# Patient Record
Sex: Male | Born: 1950
Health system: Southern US, Community
[De-identification: ages and names within clinical notes are randomized; demographics above are authoritative.]

## PROBLEM LIST (undated history)

## (undated) DIAGNOSIS — E785 Hyperlipidemia, unspecified: Secondary | ICD-10-CM

## (undated) DIAGNOSIS — E119 Type 2 diabetes mellitus without complications: Secondary | ICD-10-CM

## (undated) DIAGNOSIS — K219 Gastro-esophageal reflux disease without esophagitis: Secondary | ICD-10-CM

## (undated) DIAGNOSIS — I10 Essential (primary) hypertension: Secondary | ICD-10-CM

## (undated) HISTORY — PX: BACK SURGERY: SHX140

---

## 2016-03-06 DIAGNOSIS — R8299 Other abnormal findings in urine: Secondary | ICD-10-CM | POA: Diagnosis not present

## 2016-03-06 DIAGNOSIS — E784 Other hyperlipidemia: Secondary | ICD-10-CM | POA: Diagnosis not present

## 2016-03-06 DIAGNOSIS — Z125 Encounter for screening for malignant neoplasm of prostate: Secondary | ICD-10-CM | POA: Diagnosis not present

## 2016-03-06 DIAGNOSIS — E119 Type 2 diabetes mellitus without complications: Secondary | ICD-10-CM | POA: Diagnosis not present

## 2016-03-06 DIAGNOSIS — I10 Essential (primary) hypertension: Secondary | ICD-10-CM | POA: Diagnosis not present

## 2016-03-14 DIAGNOSIS — E784 Other hyperlipidemia: Secondary | ICD-10-CM | POA: Diagnosis not present

## 2016-03-14 DIAGNOSIS — Z0000 Encounter for general adult medical examination without abnormal findings: Secondary | ICD-10-CM | POA: Diagnosis not present

## 2016-03-14 DIAGNOSIS — J302 Other seasonal allergic rhinitis: Secondary | ICD-10-CM | POA: Diagnosis not present

## 2016-03-14 DIAGNOSIS — M25511 Pain in right shoulder: Secondary | ICD-10-CM | POA: Diagnosis not present

## 2016-03-14 DIAGNOSIS — I10 Essential (primary) hypertension: Secondary | ICD-10-CM | POA: Diagnosis not present

## 2016-03-14 DIAGNOSIS — E119 Type 2 diabetes mellitus without complications: Secondary | ICD-10-CM | POA: Diagnosis not present

## 2016-03-14 DIAGNOSIS — K279 Peptic ulcer, site unspecified, unspecified as acute or chronic, without hemorrhage or perforation: Secondary | ICD-10-CM | POA: Diagnosis not present

## 2016-03-14 DIAGNOSIS — Z6830 Body mass index (BMI) 30.0-30.9, adult: Secondary | ICD-10-CM | POA: Diagnosis not present

## 2016-03-14 DIAGNOSIS — M5380 Other specified dorsopathies, site unspecified: Secondary | ICD-10-CM | POA: Diagnosis not present

## 2016-09-10 DIAGNOSIS — I10 Essential (primary) hypertension: Secondary | ICD-10-CM | POA: Diagnosis not present

## 2016-09-10 DIAGNOSIS — E784 Other hyperlipidemia: Secondary | ICD-10-CM | POA: Diagnosis not present

## 2016-09-10 DIAGNOSIS — Z6830 Body mass index (BMI) 30.0-30.9, adult: Secondary | ICD-10-CM | POA: Diagnosis not present

## 2016-09-10 DIAGNOSIS — K279 Peptic ulcer, site unspecified, unspecified as acute or chronic, without hemorrhage or perforation: Secondary | ICD-10-CM | POA: Diagnosis not present

## 2016-09-10 DIAGNOSIS — M5380 Other specified dorsopathies, site unspecified: Secondary | ICD-10-CM | POA: Diagnosis not present

## 2016-09-10 DIAGNOSIS — E119 Type 2 diabetes mellitus without complications: Secondary | ICD-10-CM | POA: Diagnosis not present

## 2016-12-18 DIAGNOSIS — Z6829 Body mass index (BMI) 29.0-29.9, adult: Secondary | ICD-10-CM | POA: Diagnosis not present

## 2016-12-18 DIAGNOSIS — M542 Cervicalgia: Secondary | ICD-10-CM | POA: Diagnosis not present

## 2016-12-26 DIAGNOSIS — Z23 Encounter for immunization: Secondary | ICD-10-CM | POA: Diagnosis not present

## 2017-04-07 DIAGNOSIS — R82998 Other abnormal findings in urine: Secondary | ICD-10-CM | POA: Diagnosis not present

## 2017-04-07 DIAGNOSIS — I10 Essential (primary) hypertension: Secondary | ICD-10-CM | POA: Diagnosis not present

## 2017-04-07 DIAGNOSIS — E119 Type 2 diabetes mellitus without complications: Secondary | ICD-10-CM | POA: Diagnosis not present

## 2017-04-07 DIAGNOSIS — Z125 Encounter for screening for malignant neoplasm of prostate: Secondary | ICD-10-CM | POA: Diagnosis not present

## 2017-04-14 DIAGNOSIS — M5380 Other specified dorsopathies, site unspecified: Secondary | ICD-10-CM | POA: Diagnosis not present

## 2017-04-14 DIAGNOSIS — E119 Type 2 diabetes mellitus without complications: Secondary | ICD-10-CM | POA: Diagnosis not present

## 2017-04-14 DIAGNOSIS — K279 Peptic ulcer, site unspecified, unspecified as acute or chronic, without hemorrhage or perforation: Secondary | ICD-10-CM | POA: Diagnosis not present

## 2017-04-14 DIAGNOSIS — J302 Other seasonal allergic rhinitis: Secondary | ICD-10-CM | POA: Diagnosis not present

## 2017-04-14 DIAGNOSIS — E7849 Other hyperlipidemia: Secondary | ICD-10-CM | POA: Diagnosis not present

## 2017-04-14 DIAGNOSIS — M25511 Pain in right shoulder: Secondary | ICD-10-CM | POA: Diagnosis not present

## 2017-04-14 DIAGNOSIS — Z0000 Encounter for general adult medical examination without abnormal findings: Secondary | ICD-10-CM | POA: Diagnosis not present

## 2017-04-14 DIAGNOSIS — Z1389 Encounter for screening for other disorder: Secondary | ICD-10-CM | POA: Diagnosis not present

## 2017-04-14 DIAGNOSIS — I10 Essential (primary) hypertension: Secondary | ICD-10-CM | POA: Diagnosis not present

## 2017-04-14 DIAGNOSIS — Z6829 Body mass index (BMI) 29.0-29.9, adult: Secondary | ICD-10-CM | POA: Diagnosis not present

## 2017-08-18 DIAGNOSIS — H1789 Other corneal scars and opacities: Secondary | ICD-10-CM | POA: Diagnosis not present

## 2017-08-18 DIAGNOSIS — H2513 Age-related nuclear cataract, bilateral: Secondary | ICD-10-CM | POA: Diagnosis not present

## 2017-08-18 DIAGNOSIS — H524 Presbyopia: Secondary | ICD-10-CM | POA: Diagnosis not present

## 2017-08-18 DIAGNOSIS — E119 Type 2 diabetes mellitus without complications: Secondary | ICD-10-CM | POA: Diagnosis not present

## 2017-12-09 DIAGNOSIS — Z23 Encounter for immunization: Secondary | ICD-10-CM | POA: Diagnosis not present

## 2018-09-16 DIAGNOSIS — E119 Type 2 diabetes mellitus without complications: Secondary | ICD-10-CM | POA: Diagnosis not present

## 2018-09-16 DIAGNOSIS — I10 Essential (primary) hypertension: Secondary | ICD-10-CM | POA: Diagnosis not present

## 2018-10-01 DIAGNOSIS — E119 Type 2 diabetes mellitus without complications: Secondary | ICD-10-CM | POA: Diagnosis not present

## 2018-10-01 DIAGNOSIS — E7849 Other hyperlipidemia: Secondary | ICD-10-CM | POA: Diagnosis not present

## 2018-10-01 DIAGNOSIS — Z23 Encounter for immunization: Secondary | ICD-10-CM | POA: Diagnosis not present

## 2018-10-12 DIAGNOSIS — E119 Type 2 diabetes mellitus without complications: Secondary | ICD-10-CM | POA: Diagnosis not present

## 2018-10-13 DIAGNOSIS — M7581 Other shoulder lesions, right shoulder: Secondary | ICD-10-CM | POA: Diagnosis not present

## 2019-01-04 DIAGNOSIS — Z125 Encounter for screening for malignant neoplasm of prostate: Secondary | ICD-10-CM | POA: Diagnosis not present

## 2019-01-04 DIAGNOSIS — E7849 Other hyperlipidemia: Secondary | ICD-10-CM | POA: Diagnosis not present

## 2019-01-04 DIAGNOSIS — E119 Type 2 diabetes mellitus without complications: Secondary | ICD-10-CM | POA: Diagnosis not present

## 2019-05-26 DIAGNOSIS — E119 Type 2 diabetes mellitus without complications: Secondary | ICD-10-CM | POA: Diagnosis not present

## 2019-05-26 DIAGNOSIS — M25511 Pain in right shoulder: Secondary | ICD-10-CM | POA: Diagnosis not present

## 2019-05-26 DIAGNOSIS — K279 Peptic ulcer, site unspecified, unspecified as acute or chronic, without hemorrhage or perforation: Secondary | ICD-10-CM | POA: Diagnosis not present

## 2019-05-26 DIAGNOSIS — E7849 Other hyperlipidemia: Secondary | ICD-10-CM | POA: Diagnosis not present

## 2019-05-26 DIAGNOSIS — R634 Abnormal weight loss: Secondary | ICD-10-CM | POA: Diagnosis not present

## 2019-05-26 DIAGNOSIS — I10 Essential (primary) hypertension: Secondary | ICD-10-CM | POA: Diagnosis not present

## 2019-06-02 DIAGNOSIS — E7849 Other hyperlipidemia: Secondary | ICD-10-CM | POA: Diagnosis not present

## 2019-10-06 DIAGNOSIS — R634 Abnormal weight loss: Secondary | ICD-10-CM | POA: Diagnosis not present

## 2019-10-06 DIAGNOSIS — K279 Peptic ulcer, site unspecified, unspecified as acute or chronic, without hemorrhage or perforation: Secondary | ICD-10-CM | POA: Diagnosis not present

## 2019-10-06 DIAGNOSIS — I10 Essential (primary) hypertension: Secondary | ICD-10-CM | POA: Diagnosis not present

## 2019-10-06 DIAGNOSIS — E785 Hyperlipidemia, unspecified: Secondary | ICD-10-CM | POA: Diagnosis not present

## 2019-10-06 DIAGNOSIS — E119 Type 2 diabetes mellitus without complications: Secondary | ICD-10-CM | POA: Diagnosis not present

## 2019-10-06 DIAGNOSIS — M25511 Pain in right shoulder: Secondary | ICD-10-CM | POA: Diagnosis not present

## 2020-01-07 DIAGNOSIS — E119 Type 2 diabetes mellitus without complications: Secondary | ICD-10-CM | POA: Diagnosis not present

## 2020-01-07 DIAGNOSIS — H5203 Hypermetropia, bilateral: Secondary | ICD-10-CM | POA: Diagnosis not present

## 2020-01-11 DIAGNOSIS — Z23 Encounter for immunization: Secondary | ICD-10-CM | POA: Diagnosis not present

## 2020-02-09 DIAGNOSIS — K279 Peptic ulcer, site unspecified, unspecified as acute or chronic, without hemorrhage or perforation: Secondary | ICD-10-CM | POA: Diagnosis not present

## 2020-02-09 DIAGNOSIS — R634 Abnormal weight loss: Secondary | ICD-10-CM | POA: Diagnosis not present

## 2020-02-09 DIAGNOSIS — Z0000 Encounter for general adult medical examination without abnormal findings: Secondary | ICD-10-CM | POA: Diagnosis not present

## 2020-02-09 DIAGNOSIS — E119 Type 2 diabetes mellitus without complications: Secondary | ICD-10-CM | POA: Diagnosis not present

## 2020-02-09 DIAGNOSIS — M5380 Other specified dorsopathies, site unspecified: Secondary | ICD-10-CM | POA: Diagnosis not present

## 2020-02-09 DIAGNOSIS — E785 Hyperlipidemia, unspecified: Secondary | ICD-10-CM | POA: Diagnosis not present

## 2020-02-09 DIAGNOSIS — I10 Essential (primary) hypertension: Secondary | ICD-10-CM | POA: Diagnosis not present

## 2020-02-09 DIAGNOSIS — M25511 Pain in right shoulder: Secondary | ICD-10-CM | POA: Diagnosis not present

## 2020-04-07 ENCOUNTER — Other Ambulatory Visit: Payer: Self-pay

## 2020-04-07 ENCOUNTER — Emergency Department (HOSPITAL_COMMUNITY): Payer: Medicare Other

## 2020-04-07 ENCOUNTER — Inpatient Hospital Stay (HOSPITAL_COMMUNITY): Payer: Medicare Other

## 2020-04-07 ENCOUNTER — Other Ambulatory Visit (HOSPITAL_COMMUNITY): Payer: Medicare Other

## 2020-04-07 ENCOUNTER — Encounter (HOSPITAL_COMMUNITY): Payer: Self-pay

## 2020-04-07 ENCOUNTER — Observation Stay (HOSPITAL_COMMUNITY)
Admission: EM | Admit: 2020-04-07 | Discharge: 2020-04-08 | Disposition: A | Discharge status: Home / Self Care | Payer: Medicare Other | Attending: Family Medicine | Admitting: Family Medicine

## 2020-04-07 DIAGNOSIS — I672 Cerebral atherosclerosis: Secondary | ICD-10-CM | POA: Diagnosis not present

## 2020-04-07 DIAGNOSIS — I639 Cerebral infarction, unspecified: Secondary | ICD-10-CM | POA: Diagnosis present

## 2020-04-07 DIAGNOSIS — Z20822 Contact with and (suspected) exposure to covid-19: Secondary | ICD-10-CM | POA: Insufficient documentation

## 2020-04-07 DIAGNOSIS — I6322 Cerebral infarction due to unspecified occlusion or stenosis of basilar arteries: Secondary | ICD-10-CM | POA: Diagnosis not present

## 2020-04-07 DIAGNOSIS — R531 Weakness: Secondary | ICD-10-CM | POA: Diagnosis not present

## 2020-04-07 DIAGNOSIS — H539 Unspecified visual disturbance: Secondary | ICD-10-CM | POA: Insufficient documentation

## 2020-04-07 DIAGNOSIS — G459 Transient cerebral ischemic attack, unspecified: Secondary | ICD-10-CM | POA: Diagnosis not present

## 2020-04-07 DIAGNOSIS — R4781 Slurred speech: Secondary | ICD-10-CM | POA: Insufficient documentation

## 2020-04-07 DIAGNOSIS — M6281 Muscle weakness (generalized): Secondary | ICD-10-CM | POA: Diagnosis not present

## 2020-04-07 DIAGNOSIS — I10 Essential (primary) hypertension: Secondary | ICD-10-CM | POA: Diagnosis present

## 2020-04-07 DIAGNOSIS — I6503 Occlusion and stenosis of bilateral vertebral arteries: Secondary | ICD-10-CM | POA: Diagnosis not present

## 2020-04-07 DIAGNOSIS — I6389 Other cerebral infarction: Secondary | ICD-10-CM | POA: Diagnosis not present

## 2020-04-07 DIAGNOSIS — Z87891 Personal history of nicotine dependence: Secondary | ICD-10-CM | POA: Diagnosis not present

## 2020-04-07 DIAGNOSIS — G9389 Other specified disorders of brain: Secondary | ICD-10-CM | POA: Diagnosis not present

## 2020-04-07 DIAGNOSIS — I63539 Cerebral infarction due to unspecified occlusion or stenosis of unspecified posterior cerebral artery: Secondary | ICD-10-CM

## 2020-04-07 DIAGNOSIS — I6523 Occlusion and stenosis of bilateral carotid arteries: Secondary | ICD-10-CM | POA: Diagnosis not present

## 2020-04-07 DIAGNOSIS — Z7984 Long term (current) use of oral hypoglycemic drugs: Secondary | ICD-10-CM | POA: Diagnosis not present

## 2020-04-07 DIAGNOSIS — E119 Type 2 diabetes mellitus without complications: Secondary | ICD-10-CM | POA: Diagnosis not present

## 2020-04-07 DIAGNOSIS — E010 Iodine-deficiency related diffuse (endemic) goiter: Secondary | ICD-10-CM

## 2020-04-07 DIAGNOSIS — R29810 Facial weakness: Secondary | ICD-10-CM | POA: Diagnosis not present

## 2020-04-07 DIAGNOSIS — Z79899 Other long term (current) drug therapy: Secondary | ICD-10-CM | POA: Diagnosis not present

## 2020-04-07 DIAGNOSIS — E041 Nontoxic single thyroid nodule: Secondary | ICD-10-CM | POA: Diagnosis not present

## 2020-04-07 DIAGNOSIS — K219 Gastro-esophageal reflux disease without esophagitis: Secondary | ICD-10-CM | POA: Diagnosis present

## 2020-04-07 DIAGNOSIS — R29818 Other symptoms and signs involving the nervous system: Secondary | ICD-10-CM | POA: Diagnosis not present

## 2020-04-07 DIAGNOSIS — I6330 Cerebral infarction due to thrombosis of unspecified cerebral artery: Secondary | ICD-10-CM | POA: Diagnosis present

## 2020-04-07 DIAGNOSIS — I6350 Cerebral infarction due to unspecified occlusion or stenosis of unspecified cerebral artery: Principal | ICD-10-CM | POA: Insufficient documentation

## 2020-04-07 DIAGNOSIS — Q283 Other malformations of cerebral vessels: Secondary | ICD-10-CM | POA: Diagnosis not present

## 2020-04-07 DIAGNOSIS — E785 Hyperlipidemia, unspecified: Secondary | ICD-10-CM | POA: Diagnosis present

## 2020-04-07 HISTORY — DX: Gastro-esophageal reflux disease without esophagitis: K21.9

## 2020-04-07 HISTORY — DX: Essential (primary) hypertension: I10

## 2020-04-07 HISTORY — DX: Cerebral infarction, unspecified: I63.9

## 2020-04-07 HISTORY — DX: Type 2 diabetes mellitus without complications: E11.9

## 2020-04-07 HISTORY — DX: Hyperlipidemia, unspecified: E78.5

## 2020-04-07 LAB — URINALYSIS, ROUTINE W REFLEX MICROSCOPIC
Bacteria, UA: NONE SEEN
Bilirubin Urine: NEGATIVE
Glucose, UA: 50 mg/dL — AB
Ketones, ur: NEGATIVE mg/dL
Leukocytes,Ua: NEGATIVE
Nitrite: NEGATIVE
Protein, ur: NEGATIVE mg/dL
Specific Gravity, Urine: >1.046 — ABNORMAL HIGH (ref 1.005–1.030)
pH: 5 (ref 5.0–8.0)

## 2020-04-07 LAB — DIFFERENTIAL
Abs Immature Granulocytes: 0.03 10*3/uL (ref 0.00–0.07)
Basophils Absolute: 0.1 10*3/uL (ref 0.0–0.1)
Basophils Relative: 1 %
Eosinophils Absolute: 0.2 10*3/uL (ref 0.0–0.5)
Eosinophils Relative: 2 %
Immature Granulocytes: 0 %
Lymphocytes Relative: 17 %
Lymphs Abs: 1.8 10*3/uL (ref 0.7–4.0)
Monocytes Absolute: 1.1 10*3/uL — ABNORMAL HIGH (ref 0.1–1.0)
Monocytes Relative: 10 %
Neutro Abs: 7.4 10*3/uL (ref 1.7–7.7)
Neutrophils Relative %: 70 %

## 2020-04-07 LAB — RAPID URINE DRUG SCREEN, HOSP PERFORMED
Amphetamines: NOT DETECTED
Barbiturates: NOT DETECTED
Benzodiazepines: NOT DETECTED
Cocaine: NOT DETECTED
Opiates: NOT DETECTED
Tetrahydrocannabinol: NOT DETECTED

## 2020-04-07 LAB — I-STAT CHEM 8, ED
BUN: 17 mg/dL (ref 8–23)
Calcium, Ion: 1.12 mmol/L — ABNORMAL LOW (ref 1.15–1.40)
Chloride: 102 mmol/L (ref 98–111)
Creatinine, Ser: 0.6 mg/dL — ABNORMAL LOW (ref 0.61–1.24)
Glucose, Bld: 189 mg/dL — ABNORMAL HIGH (ref 70–99)
HCT: 44 % (ref 39.0–52.0)
Hemoglobin: 15 g/dL (ref 13.0–17.0)
Potassium: 6.1 mmol/L — ABNORMAL HIGH (ref 3.5–5.1)
Sodium: 136 mmol/L (ref 135–145)
TCO2: 27 mmol/L (ref 22–32)

## 2020-04-07 LAB — CBC
HCT: 43.2 % (ref 39.0–52.0)
Hemoglobin: 15.4 g/dL (ref 13.0–17.0)
MCH: 32 pg (ref 26.0–34.0)
MCHC: 35.6 g/dL (ref 30.0–36.0)
MCV: 89.8 fL (ref 80.0–100.0)
Platelets: 260 10*3/uL (ref 150–400)
RBC: 4.81 MIL/uL (ref 4.22–5.81)
RDW: 12.3 % (ref 11.5–15.5)
WBC: 10.5 10*3/uL (ref 4.0–10.5)
nRBC: 0 % (ref 0.0–0.2)

## 2020-04-07 LAB — CBG MONITORING, ED: Glucose-Capillary: 165 mg/dL — ABNORMAL HIGH (ref 70–99)

## 2020-04-07 LAB — TSH: TSH: 0.838 u[IU]/mL (ref 0.350–4.500)

## 2020-04-07 LAB — COMPREHENSIVE METABOLIC PANEL
ALT: 29 U/L (ref 0–44)
AST: 22 U/L (ref 15–41)
Albumin: 3.8 g/dL (ref 3.5–5.0)
Alkaline Phosphatase: 68 U/L (ref 38–126)
Anion gap: 9 (ref 5–15)
BUN: 12 mg/dL (ref 8–23)
CO2: 23 mmol/L (ref 22–32)
Calcium: 9.2 mg/dL (ref 8.9–10.3)
Chloride: 103 mmol/L (ref 98–111)
Creatinine, Ser: 0.76 mg/dL (ref 0.61–1.24)
GFR, Estimated: >60 mL/min (ref >60)
Glucose, Bld: 198 mg/dL — ABNORMAL HIGH (ref 70–99)
Potassium: 4.2 mmol/L (ref 3.5–5.1)
Sodium: 135 mmol/L (ref 135–145)
Total Bilirubin: 1.1 mg/dL (ref 0.3–1.2)
Total Protein: 6.5 g/dL (ref 6.5–8.1)

## 2020-04-07 LAB — GLUCOSE, CAPILLARY
Glucose-Capillary: 164 mg/dL — ABNORMAL HIGH (ref 70–99)
Glucose-Capillary: 135 mg/dL — ABNORMAL HIGH (ref 70–99)

## 2020-04-07 LAB — PROTIME-INR
INR: 1.1 (ref 0.8–1.2)
Prothrombin Time: 14 seconds (ref 11.4–15.2)

## 2020-04-07 LAB — RESP PANEL BY RT-PCR (FLU A&B, COVID) ARPGX2
Influenza A by PCR: NEGATIVE
Influenza B by PCR: NEGATIVE
SARS Coronavirus 2 by RT PCR: NEGATIVE

## 2020-04-07 LAB — T4, FREE: Free T4: 1.07 ng/dL (ref 0.61–1.12)

## 2020-04-07 LAB — ETHANOL: Alcohol, Ethyl (B): <10 mg/dL (ref <10)

## 2020-04-07 LAB — APTT: aPTT: 25 seconds (ref 24–36)

## 2020-04-07 MED ORDER — CLOPIDOGREL BISULFATE 75 MG PO TABS
75.0000 mg | ORAL_TABLET | Freq: Every day | ORAL | Status: DC
  Administered 2020-04-08: 75 mg via ORAL
  Filled 2020-04-07: qty 1

## 2020-04-07 MED ORDER — IOHEXOL 350 MG/ML SOLN
75.0000 mL | Freq: Once | INTRAVENOUS | Status: AC | PRN
  Administered 2020-04-07: 75 mL via INTRAVENOUS

## 2020-04-07 MED ORDER — CLOPIDOGREL BISULFATE 300 MG PO TABS
300.0000 mg | ORAL_TABLET | Freq: Once | ORAL | Status: AC
  Administered 2020-04-07: 300 mg via ORAL
  Filled 2020-04-07: qty 1

## 2020-04-07 MED ORDER — SODIUM CHLORIDE 0.9 % IV SOLN: INTRAVENOUS | Status: DC

## 2020-04-07 MED ORDER — ASPIRIN 300 MG RE SUPP
300.0000 mg | Freq: Every day | RECTAL | Status: DC
  Filled 2020-04-07: qty 1

## 2020-04-07 MED ORDER — ACETAMINOPHEN 325 MG PO TABS
650.0000 mg | ORAL_TABLET | ORAL | Status: DC | PRN
Start: 2020-04-07 — End: 2020-04-08

## 2020-04-07 MED ORDER — STROKE: EARLY STAGES OF RECOVERY BOOK
Freq: Once | Status: AC
  Filled 2020-04-07: qty 1

## 2020-04-07 MED ORDER — ATORVASTATIN CALCIUM 80 MG PO TABS
80.0000 mg | ORAL_TABLET | Freq: Every day | ORAL | Status: DC
  Administered 2020-04-07 – 2020-04-08 (×2): 80 mg via ORAL
  Filled 2020-04-07 (×2): qty 1

## 2020-04-07 MED ORDER — ENOXAPARIN SODIUM 40 MG/0.4ML ~~LOC~~ SOLN
40.0000 mg | SUBCUTANEOUS | Status: DC
  Administered 2020-04-07 – 2020-04-08 (×2): 40 mg via SUBCUTANEOUS
  Filled 2020-04-07 (×2): qty 0.4

## 2020-04-07 MED ORDER — ACETAMINOPHEN 650 MG RE SUPP: 650.0000 mg | RECTAL | Status: DC | PRN

## 2020-04-07 MED ORDER — ACETAMINOPHEN 160 MG/5ML PO SOLN: 650.0000 mg | ORAL | Status: DC | PRN

## 2020-04-07 MED ORDER — INSULIN ASPART 100 UNIT/ML ~~LOC~~ SOLN
0.0000 [IU] | Freq: Three times a day (TID) | SUBCUTANEOUS | Status: DC
  Administered 2020-04-07: 3 [IU] via SUBCUTANEOUS
  Administered 2020-04-08: 5 [IU] via SUBCUTANEOUS
  Administered 2020-04-08: 2 [IU] via SUBCUTANEOUS

## 2020-04-07 MED ORDER — SENNOSIDES-DOCUSATE SODIUM 8.6-50 MG PO TABS: 1.0000 | ORAL_TABLET | Freq: Every evening | ORAL | Status: DC | PRN

## 2020-04-07 MED ORDER — ONDANSETRON HCL 4 MG/2ML IJ SOLN: 4.0000 mg | Freq: Four times a day (QID) | INTRAMUSCULAR | Status: DC | PRN

## 2020-04-07 MED ORDER — ASPIRIN 81 MG PO CHEW
324.0000 mg | CHEWABLE_TABLET | Freq: Every day | ORAL | Status: DC
  Administered 2020-04-07 – 2020-04-08 (×2): 324 mg via ORAL
  Filled 2020-04-07 (×2): qty 4

## 2020-04-07 NOTE — ED Provider Notes (Signed)
[facility] EMERGENCY DEPARTMENT Provider Note   CSN: [id number] Arrival date & time: 04/07/20  5621     History Chief Complaint  Patient presents with  . Cerebrovascular Accident    Christopher Kirk is a 70 y.o. male.  HPI Patient reports that last night around 730 he had an episode whereby he could not move the left side of his body.  He reports his vision seemed off.  He reports it was 1 area of the vision that seem to have a kaleidoscope pattern and it that was interfering with his vision and there was some general blurring.  He reports this lasted for about an hour.  He describes it as having resolved.  He reports he went to sleep and then this morning when his wife came to get him or wake him he could not get up.  He reports again he could not move or control the left side of his body.  They called EMS.  He reports by the time EMS arrived he was able to stand and ambulate and could use his extremities again.  He thought he may be having mini strokes.  No headache.  Symptoms are resolved at this current time.  Patient's recent medical history is for general illness.  Patient reports he thinks he might have had COVID.  Approximately 11 days ago, he had 4 days of vomiting and diarrhea.  He reports that it started to improve and then for the subsequent 4 days he could hardly eat anything.  He reports he spent basically over a week in bed.  He reports he is only now doing slightly better.  He reports he is still mostly been bedbound, only getting up to go to the bathroom.  He reports he has been taking in fluids and "electrolytes".  He reports he had a normal bowel movement this morning.  He reports his stool is chronically loose due to Glucophage.  Patient reports he had not been able to take any of his regular medications for almost the course of his illness.  He reports last night he took a first dose of some of his regular prescribed medicines.    Past Medical History:   Diagnosis Date  . Diabetes mellitus without complication (HCC)   . Hypertension     Patient Active Problem List   Diagnosis Date Noted  . 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 Acute CVA (cerebrovascular accident) (HCC) 04/07/2020    Past Surgical History:  Procedure Laterality Date  . BACK SURGERY         No family history on file.  Social History   Tobacco Use  . Smoking status: Former Smoker    Types: Cigarettes  . Smokeless tobacco: Never Used  Substance Use Topics  . Alcohol use: Yes    Comment: occ  . Drug use: Never    Home Medications Prior to Admission medications   Medication Sig Start Date End Date Taking? Authorizing Provider  amLODipine (NORVASC) 10 MG tablet Take 10 mg by mouth daily. 03/01/20  Yes [provider]  carvedilol (COREG) 6.25 MG tablet Take 6.25 mg by mouth 2 (two) times daily. 03/01/20  Yes [provider]  irbesartan (AVAPRO) 300 MG tablet Take 300 mg by mouth daily. 02/29/20  Yes [provider]  metFORMIN (GLUCOPHAGE) 1000 MG tablet Take 1,000 mg by mouth 2 (two) times daily. 02/29/20  Yes [provider]  pravastatin (PRAVACHOL) 40 MG tablet Take 40 mg by  mouth daily. 02/29/20  Yes [provider]    Allergies    Patient has no known allergies.  Review of Systems   Review of Systems 10 systems reviewed and negative except as per HPI Physical Exam Updated Vital Signs BP (!) 174/96 (BP Location: Right Arm)   Pulse 66   Temp 98 F (36.7 C) (Oral)   Resp (!) 25   Ht 5\' 10"  (1.778 m)   Wt 81.6 kg   SpO2 100%   BMI 25.83 kg/m   Physical Exam Constitutional:      Comments: Alert and nontoxic.  Clear mental status.  No respiratory distress.  HENT:     Head: Normocephalic and atraumatic.     Nose: Nose normal.     Mouth/Throat:     Mouth: Mucous membranes are moist.     Pharynx: Oropharynx is clear.  Eyes:     Extraocular Movements: Extraocular movements intact.      Conjunctiva/sclera: Conjunctivae normal.     Pupils: Pupils are equal, round, and reactive to light.  Cardiovascular:     Rate and Rhythm: Normal rate and regular rhythm.  Pulmonary:     Effort: Pulmonary effort is normal.     Comments: Trace crackles at left lung base.  Otherwise lungs are clear with good airflow. Abdominal:     General: There is no distension.     Palpations: Abdomen is soft.     Tenderness: There is no abdominal tenderness. There is no guarding.  Musculoskeletal:        General: No swelling or tenderness. Normal range of motion.     Cervical back: Neck supple.     Right lower leg: No edema.     Left lower leg: No edema.     Comments: No peripheral edema.  Lower legs are in good condition.  Skin:    General: Skin is warm and dry.  Neurological:     Comments: Patient is alert and oriented x3.  Speech is clear with normal content.  No cranial nerve deficit.  Finger-nose exam intact.  Motor strength 5\5 upper and lower extremities.  Patient can hold and elevate legs for 10 seconds.  No sensory deficit.  No visual field cut.  Psychiatric:        Mood and Affect: Mood normal.     ED Results / Procedures / Treatments   Labs (all labs ordered are listed, but only abnormal results are displayed) Labs Reviewed  DIFFERENTIAL - Abnormal; Notable for the following components:      Result Value   Monocytes Absolute 1.1 (*)    All other components within normal limits  COMPREHENSIVE METABOLIC PANEL - Abnormal; Notable for the following components:   Glucose, Bld 198 (*)    All other components within normal limits  I-STAT CHEM 8, ED - Abnormal; Notable for the following components:   Potassium 6.1 (*)    Creatinine, Ser 0.60 (*)    Glucose, Bld 189 (*)    Calcium, Ion 1.12 (*)    All other components within normal limits  RESP PANEL BY RT-PCR (FLU A&B, COVID) ARPGX2  ETHANOL  PROTIME-INR  APTT  CBC  RAPID URINE DRUG SCREEN, HOSP PERFORMED  URINALYSIS, ROUTINE W  REFLEX MICROSCOPIC    EKG EKG Interpretation  Date/Time:  Friday April 07 2020 09:32:06 EST Ventricular Rate:  62 PR Interval:    QRS Duration: 90 QT Interval:  425 QTC Calculation: 432 R Axis:   61 Text Interpretation: Sinus  rhythm Minimal ST elevation, inferior leads early repolarization, no old co parison Confirmed by Arby Barrette [phone]) on 04/07/2020 9:39:58 AM   Radiology CT ANGIO HEAD W OR WO CONTRAST  Result Date: 04/07/2020 CLINICAL DATA:  Neuro deficit, acute, stroke suspected. EXAM: CT ANGIOGRAPHY HEAD AND NECK TECHNIQUE: Multidetector CT imaging of the head and neck was performed using the standard protocol during bolus administration of intravenous contrast. Multiplanar CT image reconstructions and MIPs were obtained to evaluate the vascular anatomy. Carotid stenosis measurements (when applicable) are obtained utilizing NASCET criteria, using the distal internal carotid diameter as the denominator. CONTRAST:  32mL OMNIPAQUE IOHEXOL 350 MG/ML SOLN COMPARISON:  MRI of the brain April 07, 2020 FINDINGS: CT HEAD FINDINGS Brain: Hypodense areas within the left occipital lobe and bilateral cerebellar hemispheres consistent with acute infarct seen on recent MRI of the brain. A hypodense area in the anterior aspect of the medulla does not have a corresponding diffusion restriction on recent MRI., may represent new infarct versus artifact. No hemorrhage, hydrocephalus, mass or midline shift. Vascular: No hyperdense vessel. Calcified plaques in the right vertebral artery. Skull: Normal. Negative for fracture or focal lesion. Sinuses: Mucosal thickening scattered throughout the paranasal sinuses. Orbits: No acute finding. Review of the MIP images confirms the above findings CTA NECK FINDINGS Aortic arch: Standard branching. Imaged portion shows no evidence of aneurysm or dissection. Mild atherosclerotic changes of the aortic arch. No significant stenosis of the major arch vessel origins. Right  carotid system: Mild atherosclerotic changes of the right carotid bifurcation. No evidence of dissection, stenosis (50% or greater) or occlusion. Left carotid system: Mild atherosclerotic changes of the left carotid bifurcation. No evidence of dissection, stenosis (50% or greater) or occlusion. Vertebral arteries: Atherosclerotic changes at the origin of the right vertebral artery and along the V1 and proximal V2 segments with multifocal area of severe stenosis. Distal V2 segment and V3 segment have normal caliber. The right vertebral artery is dominant. The left vertebral artery is hypoplastic with small caliber throughout its entire course ending in PICA. Skeleton: Negative. Other neck: Enlarged multinodular thyroid. Upper chest: Centrilobular emphysema. Review of the MIP images confirms the above findings CTA HEAD FINDINGS Anterior circulation: No significant stenosis, proximal occlusion, aneurysm, or vascular malformation. 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 The left vertebral artery is hypoplastic and ends in PICA. The basilar artery is patent, likely filled by retrograde flow from right posterior communicating artery, with diminutive caliber throughout its course which may be constitutional versus related to decreased flow. There is luminal irregularity with mild stenosis at the proximal segment suggestive of atherosclerotic disease. Diffuse luminal irregularity of the bilateral posterior cerebral arteries is also suggestive of intracranial atherosclerotic disease with focal area of moderate stenosis at the bilateral P2 segments. Venous sinuses: Poorly opacified due to contrast bolus timing. Anatomic variants: Left vertebral artery ending in PICA. Review of the MIP images confirms the above findings IMPRESSION: 1. Hypodense areas within the left occipital lobe and bilateral cerebellar hemispheres consistent with acute infarct seen on recent MRI of the brain. A  hypodense area in the anterior aspect of the medulla does not have a corresponding diffusion restriction on recent MRI, may represent new infarct versus artifact. Correlate clinically. 2. 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 The left vertebral artery is hypoplastic and ends in PICA. 3. The basilar artery is patent, likely filled by retrograde flow from right posterior communicating artery, with diminutive caliber throughout its  course which may be constitutional versus related to decreased flow. Luminal irregularity in the proximal basilar artery suggestive of intracranial atherosclerotic disease. 4. Diffuse luminal irregularity of the bilateral posterior cerebral arteries is also suggestive of intracranial atherosclerotic disease with focal area of moderate stenosis at the bilateral P2 segments. 5. Mild atherosclerotic changes of the carotid bifurcations without evidence of stenosis (50% or greater) or occlusion. 6. Enlarged multinodular thyroid thyroid gland. Recommend thyroid ultrasound for further evaluation. 7. Emphysema and aortic atherosclerosis. These results were communicated via secure text paging at the time of interpretation on 04/07/2020 at 12:39 pm to provider [facility] , who acknowledged these results. Aortic Atherosclerosis (ICD10-I70.0) and Emphysema (ICD10-J43.9). Electronically Signed   By: Baldemar Lenis M.D.   On: 04/07/2020 12:55   CT ANGIO NECK W OR WO CONTRAST  Result Date: 04/07/2020 CLINICAL DATA:  Neuro deficit, acute, stroke suspected. EXAM: CT ANGIOGRAPHY HEAD AND NECK TECHNIQUE: Multidetector CT imaging of the head and neck was performed using the standard protocol during bolus administration of intravenous contrast. Multiplanar CT image reconstructions and MIPs were obtained to evaluate the vascular anatomy. Carotid stenosis measurements (when applicable) are obtained utilizing NASCET criteria, using the distal internal carotid diameter as  the denominator. CONTRAST:  33mL OMNIPAQUE IOHEXOL 350 MG/ML SOLN COMPARISON:  MRI of the brain April 07, 2020 FINDINGS: CT HEAD FINDINGS Brain: Hypodense areas within the left occipital lobe and bilateral cerebellar hemispheres consistent with acute infarct seen on recent MRI of the brain. A hypodense area in the anterior aspect of the medulla does not have a corresponding diffusion restriction on recent MRI., may represent new infarct versus artifact. No hemorrhage, hydrocephalus, mass or midline shift. Vascular: No hyperdense vessel. Calcified plaques in the right vertebral artery. Skull: Normal. Negative for fracture or focal lesion. Sinuses: Mucosal thickening scattered throughout the paranasal sinuses. Orbits: No acute finding. Review of the MIP images confirms the above findings CTA NECK FINDINGS Aortic arch: Standard branching. Imaged portion shows no evidence of aneurysm or dissection. Mild atherosclerotic changes of the aortic arch. No significant stenosis of the major arch vessel origins. Right carotid system: Mild atherosclerotic changes of the right carotid bifurcation. No evidence of dissection, stenosis (50% or greater) or occlusion. Left carotid system: Mild atherosclerotic changes of the left carotid bifurcation. No evidence of dissection, stenosis (50% or greater) or occlusion. Vertebral arteries: Atherosclerotic changes at the origin of the right vertebral artery and along the V1 and proximal V2 segments with multifocal area of severe stenosis. Distal V2 segment and V3 segment have normal caliber. The right vertebral artery is dominant. The left vertebral artery is hypoplastic with small caliber throughout its entire course ending in PICA. Skeleton: Negative. Other neck: Enlarged multinodular thyroid. Upper chest: Centrilobular emphysema. Review of the MIP images confirms the above findings CTA HEAD FINDINGS Anterior circulation: No significant stenosis, proximal occlusion, aneurysm, or vascular  malformation. 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 The left vertebral artery is hypoplastic and ends in PICA. The basilar artery is patent, likely filled by retrograde flow from right posterior communicating artery, with diminutive caliber throughout its course which may be constitutional versus related to decreased flow. There is luminal irregularity with mild stenosis at the proximal segment suggestive of atherosclerotic disease. Diffuse luminal irregularity of the bilateral posterior cerebral arteries is also suggestive of intracranial atherosclerotic disease with focal area of moderate stenosis at the bilateral P2 segments. Venous sinuses: Poorly opacified due to contrast bolus timing.  Anatomic variants: Left vertebral artery ending in PICA. Review of the MIP images confirms the above findings IMPRESSION: 1. Hypodense areas within the left occipital lobe and bilateral cerebellar hemispheres consistent with acute infarct seen on recent MRI of the brain. A hypodense area in the anterior aspect of the medulla does not have a corresponding diffusion restriction on recent MRI, may represent new infarct versus artifact. Correlate clinically. 2. 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 The left vertebral artery is hypoplastic and ends in PICA. 3. The basilar artery is patent, likely filled by retrograde flow from right posterior communicating artery, with diminutive caliber throughout its course which may be constitutional versus related to decreased flow. Luminal irregularity in the proximal basilar artery suggestive of intracranial atherosclerotic disease. 4. Diffuse luminal irregularity of the bilateral posterior cerebral arteries is also suggestive of intracranial atherosclerotic disease with focal area of moderate stenosis at the bilateral P2 segments. 5. Mild atherosclerotic changes of the carotid bifurcations without evidence of stenosis (50% or  greater) or occlusion. 6. Enlarged multinodular thyroid thyroid gland. Recommend thyroid ultrasound for further evaluation. 7. Emphysema and aortic atherosclerosis. These results were communicated via secure text paging at the time of interpretation on 04/07/2020 at 12:39 pm to provider [facility] , who acknowledged these results. Aortic Atherosclerosis (ICD10-I70.0) and Emphysema (ICD10-J43.9). Electronically Signed   By: Baldemar LenisKatyucia  De Macedo Rodrigues M.D.   On: 04/07/2020 12:55   MR BRAIN WO CONTRAST  Result Date: 04/07/2020 CLINICAL DATA:  Diabetes and hypertension. Acute stroke presentation. EXAM: MRI HEAD WITHOUT CONTRAST TECHNIQUE: Multiplanar, multiecho pulse sequences of the brain and surrounding structures were obtained without intravenous contrast. COMPARISON:  None. FINDINGS: Brain: Diffusion imaging shows multiple scattered small acute infarctions within both cerebellar hemispheres. There is a larger confluent infarction affecting the left occipital lobe measuring up to 3 cm in size. No evidence of acute anterior circulation insult. No evidence of hemorrhage or significant swelling/mass effect. Elsewhere, there is mild age related volume loss with minimal small vessel change of the cerebral hemispheric white matter. No hydrocephalus. Vascular: Flow is present in both carotid arteries. There is abnormal flow in the posterior circulation. Left vertebral artery is probably a tiny vessel that terminates in PICA. Dominant right vertebral artery shows atherosclerosis and does not show abnormal flow. There is also not normal flow in the proximal basilar artery. Skull and upper cervical spine: Negative Sinuses/Orbits: Clear/normal Other: None IMPRESSION: 1. Multiple scattered small acute infarctions within both cerebellar hemispheres. Larger confluent infarction affecting the left occipital lobe measuring up to 3 cm in size. No evidence of hemorrhage or mass effect. 2. Abnormal flow in the posterior  circulation. Left vertebral artery is probably a tiny vessel that terminates in PICA. There is an abnormal flow appearance of the dominant right vertebral artery and proximal basilar artery. Electronically Signed   By: Paulina FusiMark  Shogry M.D.   On: 04/07/2020 11:45    Procedures Procedures  CRITICAL CARE Performed by: Arby BarretteMarcy Shalom Ware   Total critical care time: 30  minutes  Critical care time was exclusive of separately billable procedures and treating other patients.  Critical care was necessary to treat or prevent imminent or life-threatening deterioration.  Critical care was time spent personally by me on the following activities: development of treatment plan with patient and/or surrogate as well as nursing, discussions with consultants, evaluation of patient's response to treatment, examination of patient, obtaining history from patient or surrogate, ordering and performing treatments and interventions, ordering and review  of laboratory studies, ordering and review of radiographic studies, pulse oximetry and re-evaluation of patient's condition. Medications Ordered in ED Medications  clopidogrel (PLAVIX) tablet 300 mg (has no administration in time range)  clopidogrel (PLAVIX) tablet 75 mg (has no administration in time range)  aspirin chewable tablet 324 mg (has no administration in time range)    Or  aspirin suppository 300 mg (has no administration in time range)  atorvastatin (LIPITOR) tablet 80 mg (has no administration in time range)  iohexol (OMNIPAQUE) 350 MG/ML injection 75 mL (75 mLs Intravenous Contrast Given 04/07/20 1231)    ED Course  I have reviewed the triage vital signs and the nursing notes.  Pertinent labs & imaging results that were available during my care of the patient were reviewed by me and considered in my medical decision making (see chart for details).  Clinical Course as of 04/07/20 1308  Fri Apr 07, 2020  1005 Consult: Reviewed with Dr. Iver Nestle neurology.   She advises to proceed with MRI in favor of CT due to high probability of TIA.  At that time she will determine if patient should go on to have CT angiogram versus MR angio. [MP]    Clinical Course User Index [MP] Arby Barrette, MD   MDM Rules/Calculators/A&P                          Patient arrives with previous left-sided weakness and dysfunction.  This has resolved.  He describes either 2 episodes or possibly 1 continuous episode as the patient awakened with symptoms this morning.  Symptoms are now resolved.  He described dense neurologic symptoms concerning for TIA /stroke.  Exam is normal on arrival to the emergency department.  MRI scan confirms acute areas of embolic stroke.  Patient's initial evaluation emergency department is for NIH score of 0.  His mental status is clear without signs of encephalopathy.  Patient will require admission and ongoing management by neurology and admitted to medical service. Final Clinical Impression(s) / ED Diagnoses Final diagnoses:  Acute CVA (cerebrovascular accident) [facility])    Rx / DC Orders ED Discharge Orders    None       Arby Barrette, MD 04/07/20 1317

## 2020-04-07 NOTE — ED Triage Notes (Signed)
Pt arrived via [facility] from home for right arm, right leg, right side of face weakness and slurred speech that started at 0730 this morning. Pt states he had similar sx last night, but not sure what time last night. Pt states the sx are worse today. Pt states has had N/V/D, Hax1 wk. Pt states has not taken meds for past 1.5 wks due to N/V. Pt is A&Ox4.  Pt is hyper tensive. NSR on monitor.

## 2020-04-07 NOTE — ED Notes (Signed)
Dinner Tray Ordered @ 1707. 

## 2020-04-07 NOTE — ED Notes (Signed)
Patient transported to CT 

## 2020-04-07 NOTE — ED Notes (Signed)
Transported to 3W-6 via transport svc

## 2020-04-07 NOTE — ED Notes (Signed)
Patient transported to MRI 

## 2020-04-07 NOTE — Procedures (Signed)
Echo attempted. Patient being taken to ultrasound. Will attempt echo again as time permits.

## 2020-04-07 NOTE — ED Notes (Signed)
Waiting on transport svc to transfer pt to 3w-6

## 2020-04-07 NOTE — Consult Note (Addendum)
Neurology Consultation Reason for Consult: stroke Requesting Physician: Charlesetta Shanks  CC: suspected TIA  History is obtained from:chart review, patient, and wife  HPI: Christopher Kirk is a 70 y.o. male with significant PMH of DM, HTN, HLD, abnormal weight loss, peptic ulcer, cataracts bilaterally, prior smoker who presents with weakness of left side of his body and visual disturbances beginning approximately 1930 yesterday. Initial incident lasted about 5 minutes and experienced full recovery so did not present to ED. He woke up this morning with recurrent symptoms of left extremity weakness. Wife also endorses that he had right mouth crease droop and dysarthria. He was unable to sit up in bed so she called EMS. By the time they got there, approximately 45 minutes after onset of symptoms this morning, he was sitting up in bed and reports being almost fully recovered. They deny noticing any twitching, shaking, or repetitive movements.  He does endorse that for the last week, he has had off and on headache that he describes as throbbing achiness on his R side from the back of his neck going to the top of the head and then forward behind his R eye. He is not someone who usually has headaches. Reports in addition, has been feeling sick and nausea and skipped multiple medications. Endorses had some R sided vision blurriness and seeing flareup of red and green. Also feels that his vision is "wonky" and has noticed that the size of some objest is disproportoinate. Denies any vision loss, vision is now back to normal. Denies any perceived deficits and reports feeling back to his normal self.  Had recent illness and had been recovering. COVID test pending currently. He missed approximately 1 week of all his medication due to illness and started back again yesterday.  LKW: yesterday evening prior to 1930. tPA given?: No, outside window and resolution of symptoms. Thrombectomy: No, resolution of  symptoms. Premorbid modified rankin scale:     0 - No symptoms.  ROS: All other review of systems was negative except as noted in the HPI.  Past Medical History:  Diagnosis Date  . Diabetes mellitus without complication ([place])   . Hypertension    HLD, abnormal weight loss, peptic ulcer, cataracts bilaterally  No family history on file.  Social History:  reports that he has quit smoking. His smoking use included cigarettes. He has never used smokeless tobacco. He reports current alcohol use. He reports that he does not use drugs.  Exam: Current vital signs: BP (!) 166/97   Pulse 66   Temp 98 F (36.7 C) (Oral)   Resp 18   Ht '5\' 10"'  (1.778 m)   Wt 81.6 kg   SpO2 100%   BMI 25.83 kg/m  Vital signs in last 24 hours: Temp:  [98 F (36.7 C)] 98 F (36.7 C) (03/11 0933) Pulse Rate:  [64-66] 66 (03/11 1030) Resp:  [16-20] 18 (03/11 1030) BP: (166-196)/(90-100) 166/97 (03/11 1030) SpO2:  [98 %-100 %] 100 % (03/11 1030) Weight:  [81.6 kg] 81.6 kg (03/11 0934)   Physical Exam  Constitutional: Appears well-developed and well-nourished.  Psych: Affect appropriate to situation Eyes: No scleral injection HENT: No oropharyngeal obstruction.  MSK: no joint deformities.  Cardiovascular: Normal rate and regular rhythm.  Respiratory: Effort normal, non-labored breathing GI: Soft.  No distension. There is no tenderness.  Skin: Warm dry and intact visible skin  Neuro: Mental Status: Patient is awake, alert, oriented to person, place, month, year, and situation. Patient is able to  give a clear and coherent history. No signs of aphasia or neglect Object identification normal Cranial Nerves: II: Visual Fields are full. Pupils are equal, round, and reactive to light.  III,IV, VI: EOMI without ptosis or diploplia.  V: Facial sensation is symmetric to temperature VII: Facial movement is symmetric.  VIII: hearing is intact to voice X: Uvula elevates symmetrically XI: Shoulder shrug  is symmetric. XII: tongue is midline without atrophy or fasciculations.  Motor: Tone is normal. Bulk is normal. 5/5 strength was present in all four extremities. Negative pronator drift Sensory: Sensation is symmetric to light touch and temperature in the arms and legs. Deep Tendon Reflexes: 2+ and symmetric in the biceps and patellae.  Plantars: Toes are downgoing bilaterally.  Cerebellar: FNF and HKS are intact bilaterally  NIHSS total 0   I have reviewed labs in epic and the results pertinent to this consultation are: Glucose of 198.  I have reviewed the images obtained: CT Angio head and neck: 1. Hypodense areas within the left occipital lobe and bilateral cerebellar hemispheres consistent with acute infarct seen on recent MRI of the brain. A hypodense area in the anterior aspect of the medulla does not have a corresponding diffusion restriction on recent MRI, may represent new infarct versus artifact. Correlate clinically. 2. 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 The left vertebral artery is hypoplastic and ends in PICA. 3. The basilar artery is patent, likely filled by retrograde flow from right posterior communicating artery, with diminutive caliber throughout its course which may be constitutional versus related to decreased flow. Luminal irregularity in the proximal basilar artery suggestive of intracranial atherosclerotic disease. 4. Diffuse luminal irregularity of the bilateral posterior cerebral arteries is also suggestive of intracranial atherosclerotic disease with focal area of moderate stenosis at the bilateral P2 segments. 5. Mild atherosclerotic changes of the carotid bifurcations without evidence of stenosis (50% or greater) or occlusion. 6. Enlarged multinodular thyroid thyroid gland. Recommend thyroid ultrasound for further evaluation. 7. Emphysema and aortic atherosclerosis.  MRI Brain without contrast: 1. Multiple scattered  small acute infarctions within both cerebellar hemispheres. Larger confluent infarction affecting the left occipital lobe measuring up to 3 cm in size. No evidence of hemorrhage or mass effect. 2. Abnormal flow in the posterior circulation. Left vertebral artery is probably a tiny vessel that terminates in PICA. There is an abnormal flow appearance of the dominant right vertebral artery and proximal basilar artery.  Impression: 70yo male with PMH of DM, HTN, HLD presents with self resolved episode of left sided weakness x 2 and episode of "wonky vision" and R sided headache with resolution of his neuro symptoms and still has R sided headache. MRI brain demonstrates scattered embolic appearing posterior circulation strokes with multifocal multivessel intracranial stenosis, most notably basilar stenosis.   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 - Stroke labs TSH, ESR, RPR, HgbA1c, fasting lipid panel - Frequent neuro checks - Echocardiogram - Prophylactic therapy-Antiplatelet med: Aspirin 39m and plavix 3092monce now. Followed by Aspirin 815mnd plavix 33m27mily for 90 days. After 90 days, Aspirin 81mg10mly alone. - I switched him to Atorvastatin 80mg 39my which is high intensity statin from Pravastatin 40mg d41m which is a moderate intensity statin. - Risk factor modification - Telemetry monitoring; 30 day event monitor on discharge if no arrythmias captured  - Blood pressure goal: Permissive hypertension to 220/110. Hold home antiHTNive meds. - PT consult, OT consult,  Speech consult, unless patient is back to baseline - Stroke team to follow  - HTN- hypertensive on presentation to 196/90. Goal Blood pressure < 220/110. Hold home medications for 24 hours and only use PRN for SBP > 220 or DBP > 110. Gradual normotension after 24 hours.   - DM- home medications metformin.    [place] Pager Number [phone]

## 2020-04-07 NOTE — H&P (Signed)
History and Physical    Christopher Kirk XBJ:[id number] DOB: 20-Nov-1950 DOA: 04/07/2020  PCP:  [facility]:  None Patient coming from:  Home - lives with wife of 48 years; NOK:  Wife, [phone]  Chief Complaint: Stroke-like symptoms  HPI: Christopher Kirk is a 70 y.o. male with medical history significant of HTN; HLD; and DM presenting with stroke-like symptoms.  He reports that he "felt squirrelly".  Speech slurred, unable to move L arm, tingling on L side.  He has been sick for over a week.  He thought he had COVID - couldn't keep anything down, had a headache.  He never lost his sense of smell or taste or had a fever.  He had L-sided weakness last night arm > leg, lasted 5-10 minutes and resolved completely.  He had recurrent symptoms this AM, unable to move his arm > leg on the L, couldn't talk.  Symptoms today lasted about 45-60 minutes.  He still feels a little like he is in a fog, hard to focus on his phone.  No dysarthria.    He does not think he had dysphagia at all.  He didn't take his medications due to inability to keep anything down for about a week and thinks this led to symptoms.    ED Course:  Stuttering symptoms last night - dense weakness but resolved in an hour.  Redeveloped symptoms this AM and then resolved again.  GI symptoms for 5 days and then fatigue and now this, COVID negative.  MRI with multiple infarcts.  Neurology is consulting.  Currently with a normal exam.  Review of Systems: As per HPI; otherwise review of systems reviewed and negative.   Ambulatory Status:  Ambulates without assistance  COVID Vaccine Status:  Complete but booster  Past Medical History:  Diagnosis Date  . CVA (cerebral vascular accident) (HCC) 04/07/2020  . Diabetes mellitus without complication (HCC)   . Dyslipidemia   . GERD (gastroesophageal reflux disease)   . Hypertension     Past Surgical History:  Procedure Laterality Date  . BACK SURGERY      Social  History   Socioeconomic History  . Marital status: Married    Spouse name: Not on file  . Number of children: Not on file  . Years of education: Not on file  . Highest education level: Not on file  Occupational History  . Occupation: retired  Tobacco Use  . Smoking status: Former Smoker    Packs/day: 3.00    Years: 20.00    Pack years: 60.00    Types: Cigarettes    Quit date: 1987    Years since quitting: 35.2  . Smokeless tobacco: Never Used  Substance and Sexual Activity  . Alcohol use: Yes    Comment: occ  . Drug use: Never  . Sexual activity: Not on file  Other Topics Concern  . Not on file  Social History Narrative  . Not on file   Social Determinants of Health   Financial Resource Strain: Not on file  Food Insecurity: Not on file  Transportation Needs: Not on file  Physical Activity: Not on file  Stress: Not on file  Social Connections: Not on file  Intimate Partner Violence: Not on file    No Known Allergies  Family History  Problem Relation Age of Onset  . Stroke Father   . Stroke Sister   . Stroke Brother        x 3 brothers  Prior to Admission medications   Medication Sig Start Date End Date Taking? Authorizing Provider  amLODipine (NORVASC) 10 MG tablet Take 10 mg by mouth daily. 03/01/20  Yes [provider]  carvedilol (COREG) 6.25 MG tablet Take 6.25 mg by mouth 2 (two) times daily. 03/01/20  Yes [provider]  irbesartan (AVAPRO) 300 MG tablet Take 300 mg by mouth daily. 02/29/20  Yes [provider]  metFORMIN (GLUCOPHAGE) 1000 MG tablet Take 1,000 mg by mouth 2 (two) times daily. 02/29/20  Yes [provider]  pravastatin (PRAVACHOL) 40 MG tablet Take 40 mg by mouth daily. 02/29/20  Yes [provider]    Physical Exam: Vitals:   04/07/20 1009 04/07/20 1030 04/07/20 1200 04/07/20 1209  BP: (!) 181/100 (!) 166/97 (!) 174/96 (!) 174/96  Pulse: 64 66 66 66  Resp: 20 18 (!) 25 (!) 25  Temp:       TempSrc:      SpO2: 99% 100% 100% 100%  Weight:      Height:         . General:  Appears calm and comfortable and is in NAD . Eyes:  PERRL, EOMI, normal lids, iris . ENT:  grossly normal hearing, lips & tongue, mmm; appropriate dentition . Neck:  no LAD, masses or thyromegaly . Cardiovascular:  RRR, no m/r/g. No LE edema.  Marland Kitchen Respiratory:   CTA bilaterally with no wheezes/rales/rhonchi.  Normal respiratory effort. . Abdomen:  soft, NT, ND . Skin:  no rash or induration seen on limited exam . Musculoskeletal:  grossly normal tone BUE/BLE, good ROM, no bony abnormality . Psychiatric:  grossly normal mood and affect, speech fluent and appropriate, AOx3 . Neurologic:  CN 2-12 grossly intact, moves all extremities in coordinated fashion    Radiological Exams on Admission: Independently reviewed - see discussion in A/P where applicable  CT ANGIO HEAD W OR WO CONTRAST  Result Date: 04/07/2020 CLINICAL DATA:  Neuro deficit, acute, stroke suspected. EXAM: CT ANGIOGRAPHY HEAD AND NECK TECHNIQUE: Multidetector CT imaging of the head and neck was performed using the standard protocol during bolus administration of intravenous contrast. Multiplanar CT image reconstructions and MIPs were obtained to evaluate the vascular anatomy. Carotid stenosis measurements (when applicable) are obtained utilizing NASCET criteria, using the distal internal carotid diameter as the denominator. CONTRAST:  8mL OMNIPAQUE IOHEXOL 350 MG/ML SOLN COMPARISON:  MRI of the brain April 07, 2020 FINDINGS: CT HEAD FINDINGS Brain: Hypodense areas within the left occipital lobe and bilateral cerebellar hemispheres consistent with acute infarct seen on recent MRI of the brain. A hypodense area in the anterior aspect of the medulla does not have a corresponding diffusion restriction on recent MRI., may represent new infarct versus artifact. No hemorrhage, hydrocephalus, mass or midline shift. Vascular: No hyperdense vessel. Calcified  plaques in the right vertebral artery. Skull: Normal. Negative for fracture or focal lesion. Sinuses: Mucosal thickening scattered throughout the paranasal sinuses. Orbits: No acute finding. Review of the MIP images confirms the above findings CTA NECK FINDINGS Aortic arch: Standard branching. Imaged portion shows no evidence of aneurysm or dissection. Mild atherosclerotic changes of the aortic arch. No significant stenosis of the major arch vessel origins. Right carotid system: Mild atherosclerotic changes of the right carotid bifurcation. No evidence of dissection, stenosis (50% or greater) or occlusion. Left carotid system: Mild atherosclerotic changes of the left carotid bifurcation. No evidence of dissection, stenosis (50% or greater) or occlusion. Vertebral arteries: Atherosclerotic changes at the origin of the right  vertebral artery and along the V1 and proximal V2 segments with multifocal area of severe stenosis. Distal V2 segment and V3 segment have normal caliber. The right vertebral artery is dominant. The left vertebral artery is hypoplastic with small caliber throughout its entire course ending in PICA. Skeleton: Negative. Other neck: Enlarged multinodular thyroid. Upper chest: Centrilobular emphysema. Review of the MIP images confirms the above findings CTA HEAD FINDINGS Anterior circulation: No significant stenosis, proximal occlusion, aneurysm, or vascular malformation. 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 The left vertebral artery is hypoplastic and ends in PICA. The basilar artery is patent, likely filled by retrograde flow from right posterior communicating artery, with diminutive caliber throughout its course which may be constitutional versus related to decreased flow. There is luminal irregularity with mild stenosis at the proximal segment suggestive of atherosclerotic disease. Diffuse luminal irregularity of the bilateral posterior cerebral  arteries is also suggestive of intracranial atherosclerotic disease with focal area of moderate stenosis at the bilateral P2 segments. Venous sinuses: Poorly opacified due to contrast bolus timing. Anatomic variants: Left vertebral artery ending in PICA. Review of the MIP images confirms the above findings IMPRESSION: 1. Hypodense areas within the left occipital lobe and bilateral cerebellar hemispheres consistent with acute infarct seen on recent MRI of the brain. A hypodense area in the anterior aspect of the medulla does not have a corresponding diffusion restriction on recent MRI, may represent new infarct versus artifact. Correlate clinically. 2. 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 The left vertebral artery is hypoplastic and ends in PICA. 3. The basilar artery is patent, likely filled by retrograde flow from right posterior communicating artery, with diminutive caliber throughout its course which may be constitutional versus related to decreased flow. Luminal irregularity in the proximal basilar artery suggestive of intracranial atherosclerotic disease. 4. Diffuse luminal irregularity of the bilateral posterior cerebral arteries is also suggestive of intracranial atherosclerotic disease with focal area of moderate stenosis at the bilateral P2 segments. 5. Mild atherosclerotic changes of the carotid bifurcations without evidence of stenosis (50% or greater) or occlusion. 6. Enlarged multinodular thyroid thyroid gland. Recommend thyroid ultrasound for further evaluation. 7. Emphysema and aortic atherosclerosis. These results were communicated via secure text paging at the time of interpretation on 04/07/2020 at 12:39 pm to provider [facility] , who acknowledged these results. Aortic Atherosclerosis (ICD10-I70.0) and Emphysema (ICD10-J43.9). Electronically Signed   By: Baldemar Lenis M.D.   On: 04/07/2020 12:55   CT ANGIO NECK W OR WO CONTRAST  Result Date:  04/07/2020 CLINICAL DATA:  Neuro deficit, acute, stroke suspected. EXAM: CT ANGIOGRAPHY HEAD AND NECK TECHNIQUE: Multidetector CT imaging of the head and neck was performed using the standard protocol during bolus administration of intravenous contrast. Multiplanar CT image reconstructions and MIPs were obtained to evaluate the vascular anatomy. Carotid stenosis measurements (when applicable) are obtained utilizing NASCET criteria, using the distal internal carotid diameter as the denominator. CONTRAST:  22mL OMNIPAQUE IOHEXOL 350 MG/ML SOLN COMPARISON:  MRI of the brain April 07, 2020 FINDINGS: CT HEAD FINDINGS Brain: Hypodense areas within the left occipital lobe and bilateral cerebellar hemispheres consistent with acute infarct seen on recent MRI of the brain. A hypodense area in the anterior aspect of the medulla does not have a corresponding diffusion restriction on recent MRI., may represent new infarct versus artifact. No hemorrhage, hydrocephalus, mass or midline shift. Vascular: No hyperdense vessel. Calcified plaques in the right vertebral artery. Skull: Normal.  Negative for fracture or focal lesion. Sinuses: Mucosal thickening scattered throughout the paranasal sinuses. Orbits: No acute finding. Review of the MIP images confirms the above findings CTA NECK FINDINGS Aortic arch: Standard branching. Imaged portion shows no evidence of aneurysm or dissection. Mild atherosclerotic changes of the aortic arch. No significant stenosis of the major arch vessel origins. Right carotid system: Mild atherosclerotic changes of the right carotid bifurcation. No evidence of dissection, stenosis (50% or greater) or occlusion. Left carotid system: Mild atherosclerotic changes of the left carotid bifurcation. No evidence of dissection, stenosis (50% or greater) or occlusion. Vertebral arteries: Atherosclerotic changes at the origin of the right vertebral artery and along the V1 and proximal V2 segments with multifocal area  of severe stenosis. Distal V2 segment and V3 segment have normal caliber. The right vertebral artery is dominant. The left vertebral artery is hypoplastic with small caliber throughout its entire course ending in PICA. Skeleton: Negative. Other neck: Enlarged multinodular thyroid. Upper chest: Centrilobular emphysema. Review of the MIP images confirms the above findings CTA HEAD FINDINGS Anterior circulation: No significant stenosis, proximal occlusion, aneurysm, or vascular malformation. 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 The left vertebral artery is hypoplastic and ends in PICA. The basilar artery is patent, likely filled by retrograde flow from right posterior communicating artery, with diminutive caliber throughout its course which may be constitutional versus related to decreased flow. There is luminal irregularity with mild stenosis at the proximal segment suggestive of atherosclerotic disease. Diffuse luminal irregularity of the bilateral posterior cerebral arteries is also suggestive of intracranial atherosclerotic disease with focal area of moderate stenosis at the bilateral P2 segments. Venous sinuses: Poorly opacified due to contrast bolus timing. Anatomic variants: Left vertebral artery ending in PICA. Review of the MIP images confirms the above findings IMPRESSION: 1. Hypodense areas within the left occipital lobe and bilateral cerebellar hemispheres consistent with acute infarct seen on recent MRI of the brain. A hypodense area in the anterior aspect of the medulla does not have a corresponding diffusion restriction on recent MRI, may represent new infarct versus artifact. Correlate clinically. 2. 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 The left vertebral artery is hypoplastic and ends in PICA. 3. The basilar artery is patent, likely filled by retrograde flow from right posterior communicating artery, with diminutive caliber  throughout its course which may be constitutional versus related to decreased flow. Luminal irregularity in the proximal basilar artery suggestive of intracranial atherosclerotic disease. 4. Diffuse luminal irregularity of the bilateral posterior cerebral arteries is also suggestive of intracranial atherosclerotic disease with focal area of moderate stenosis at the bilateral P2 segments. 5. Mild atherosclerotic changes of the carotid bifurcations without evidence of stenosis (50% or greater) or occlusion. 6. Enlarged multinodular thyroid thyroid gland. Recommend thyroid ultrasound for further evaluation. 7. Emphysema and aortic atherosclerosis. These results were communicated via secure text paging at the time of interpretation on 04/07/2020 at 12:39 pm to provider [facility] , who acknowledged these results. Aortic Atherosclerosis (ICD10-I70.0) and Emphysema (ICD10-J43.9). Electronically Signed   By: Baldemar Lenis M.D.   On: 04/07/2020 12:55   MR BRAIN WO CONTRAST  Result Date: 04/07/2020 CLINICAL DATA:  Diabetes and hypertension. Acute stroke presentation. EXAM: MRI HEAD WITHOUT CONTRAST TECHNIQUE: Multiplanar, multiecho pulse sequences of the brain and surrounding structures were obtained without intravenous contrast. COMPARISON:  None. FINDINGS: Brain: Diffusion imaging shows multiple scattered small acute infarctions within both cerebellar hemispheres. There is  a larger confluent infarction affecting the left occipital lobe measuring up to 3 cm in size. No evidence of acute anterior circulation insult. No evidence of hemorrhage or significant swelling/mass effect. Elsewhere, there is mild age related volume loss with minimal small vessel change of the cerebral hemispheric white matter. No hydrocephalus. Vascular: Flow is present in both carotid arteries. There is abnormal flow in the posterior circulation. Left vertebral artery is probably a tiny vessel that terminates in PICA. Dominant  right vertebral artery shows atherosclerosis and does not show abnormal flow. There is also not normal flow in the proximal basilar artery. Skull and upper cervical spine: Negative Sinuses/Orbits: Clear/normal Other: None IMPRESSION: 1. Multiple scattered small acute infarctions within both cerebellar hemispheres. Larger confluent infarction affecting the left occipital lobe measuring up to 3 cm in size. No evidence of hemorrhage or mass effect. 2. Abnormal flow in the posterior circulation. Left vertebral artery is probably a tiny vessel that terminates in PICA. There is an abnormal flow appearance of the dominant right vertebral artery and proximal basilar artery. Electronically Signed   By: Paulina FusiMark  Shogry M.D.   On: 04/07/2020 11:45    EKG: Independently reviewed.  NSR with rate 62; nonspecific ST changes with no evidence of acute ischemia   Labs on Admission: I have personally reviewed the available labs and imaging studies at the time of the admission.  Pertinent labs:   Glucose 198 Normal CBC COVID/flu negative ETOH <10   Assessment/Plan Principal Problem:   Acute CVA (cerebrovascular accident) (HCC) Active Problems:   Cerebral thrombosis with cerebral infarction   Dyslipidemia   Hypertension   Diabetes mellitus without complication (HCC)   GERD (gastroesophageal reflux disease)    CVA -Patient had 2 transient episodes in the last 24 hours of stuttering LLUE > LLE weakness -His only residual symptom currently is a bit of altered sensorium -Concerning for TIA/CVA -ABCD2 score is 6 -tPA can be given within 4.5 hours of symptom onset; this patient was not deemed to be a candidate for tPA therapy due to mild/resolved symptoms -Aspirin has been given to reduce stroke mortality and decrease morbidity -MRI has confirmed CVA - multiple scattered small acute infarctions in both cerebellar hemispheres and larger infarction in the L occipital lobe with abnormal posterior circulation -CTA  shows hypoplastic L vertebral artery with 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 -Risk stratification with FLP, A1c; will also check TSH  -Patient will need DAPT for 21 days when ABCD2 score is at least 4 and NIH score is 3 or less, and then can transition to monotherapy with a single antiplatelet agent.  Will defer to neurology for now. -Neurology consult -PT/OT/ST/Nutrition Consults  HTN -Allow permissive HTN for now -Treat BP only if >220/120, and then with goal of 15% reduction -Hold Norvasc, Coreg, Avapro and plan to restart in 48-72 hours   HLD -Check FLP -Resume statin but will change Pravachol to Lipitor 80 mg daily   DM -Check A1c -Hold home PO Glucophage -Will order moderate-scale SSI  GERD  -He reports taking his wife's medication for this issue  Thyromegaly -Enlarged multinodular thyroid noted on CTA -Will order TSH/free T4 and thyroid US    Note: This patient has been tested  and is negative for the novel coronavirus COVID-19. She has been fully vaccinated against COVID-19.    DVT prophylaxis:  Lovenox  Code Status: DNR - confirmed with patient/family Family Communication: Wife present throughout evaluation Disposition Plan:  The patient is from: home  Anticipated d/c is to: home without [facility] services  Anticipated d/c date will depend on clinical response to treatment, likely 2-3 days  Patient is currently: acutely ill Consults called: Neurology; PT/OT/ST/Nutrition Admission status: Admit - It is my clinical opinion that admission to INPATIENT is reasonable and necessary because of the expectation that this patient will require hospital care  that crosses at least 2 midnights to treat this condition based on the medical complexity of the problems presented.  Given the aforementioned information, the predictability of an adverse outcome is felt to be significant.    Jonah Blue MD [facility]   How to contact the [facility] Attending or Consulting provider 7A - 7P or covering provider during after hours 7P -7A, for this patient?  1. Check the care team in [facility] and look for a) attending/consulting [facility] provider listed and b) the [facility] team listed 2. Log into [email or web address] and use [facility]'s universal password to access. If you do not have the password, please contact the hospital operator. 3. Locate the [facility] provider you are looking for under [facility] and page to a number that you can be directly reached. 4. If you still have difficulty reaching the provider, please page the [facility] (Director on Call) for the Hospitalists listed on amion for assistance.   04/07/2020, 3:28 PM

## 2020-04-08 ENCOUNTER — Inpatient Hospital Stay (HOSPITAL_BASED_OUTPATIENT_CLINIC_OR_DEPARTMENT_OTHER): Payer: Medicare Other

## 2020-04-08 DIAGNOSIS — I639 Cerebral infarction, unspecified: Secondary | ICD-10-CM | POA: Diagnosis not present

## 2020-04-08 DIAGNOSIS — I6389 Other cerebral infarction: Secondary | ICD-10-CM

## 2020-04-08 LAB — GLUCOSE, CAPILLARY
Glucose-Capillary: 139 mg/dL — ABNORMAL HIGH (ref 70–99)
Glucose-Capillary: 204 mg/dL — ABNORMAL HIGH (ref 70–99)
Glucose-Capillary: 169 mg/dL — ABNORMAL HIGH (ref 70–99)

## 2020-04-08 LAB — LIPID PANEL
Cholesterol: 149 mg/dL (ref 0–200)
HDL: 30 mg/dL — ABNORMAL LOW (ref >40)
LDL Cholesterol: 81 mg/dL (ref 0–99)
Total CHOL/HDL Ratio: 5 RATIO
Triglycerides: 192 mg/dL — ABNORMAL HIGH (ref <150)
VLDL: 38 mg/dL (ref 0–40)

## 2020-04-08 LAB — HEMOGLOBIN A1C
Hgb A1c MFr Bld: 6.1 % — ABNORMAL HIGH (ref 4.8–5.6)
Mean Plasma Glucose: 128.37 mg/dL

## 2020-04-08 LAB — ECHOCARDIOGRAM COMPLETE
Area-P 1/2: 3.5 cm2
Height: 70 in
S' Lateral: 2.3 cm
Weight: 2880 oz

## 2020-04-08 LAB — HIV ANTIBODY (ROUTINE TESTING W REFLEX): HIV Screen 4th Generation wRfx: NONREACTIVE

## 2020-04-08 MED ORDER — CLOPIDOGREL BISULFATE 75 MG PO TABS: 75.0000 mg | ORAL_TABLET | Freq: Every day | ORAL | 2 refills | Status: DC

## 2020-04-08 MED ORDER — AMLODIPINE BESYLATE 10 MG PO TABS: 10.0000 mg | ORAL_TABLET | Freq: Every day | ORAL | Status: DC

## 2020-04-08 MED ORDER — ATORVASTATIN CALCIUM 80 MG PO TABS: 80.0000 mg | ORAL_TABLET | Freq: Every day | ORAL | 0 refills | Status: DC

## 2020-04-08 MED ORDER — ASPIRIN 81 MG PO TBEC: 81.0000 mg | DELAYED_RELEASE_TABLET | Freq: Every day | ORAL | 11 refills | Status: AC

## 2020-04-08 MED ORDER — IRBESARTAN 300 MG PO TABS
300.0000 mg | ORAL_TABLET | Freq: Every day | ORAL | Status: AC
Start: 2020-04-10 — End: ?

## 2020-04-08 MED ORDER — CARVEDILOL 6.25 MG PO TABS: 6.2500 mg | ORAL_TABLET | Freq: Two times a day (BID) | ORAL | Status: AC

## 2020-04-08 MED ORDER — ASPIRIN EC 81 MG PO TBEC
81.0000 mg | DELAYED_RELEASE_TABLET | Freq: Every day | ORAL | Status: DC
  Administered 2020-04-08: 81 mg via ORAL
  Filled 2020-04-08: qty 1

## 2020-04-08 NOTE — Progress Notes (Signed)
  Echocardiogram 2D Echocardiogram has been performed.  Pieter Partridge 04/08/2020, 1:21 PM

## 2020-04-08 NOTE — Evaluation (Signed)
Occupational Therapy Evaluation Patient Details Name: Christopher Kirk MRN: [id number] DOB: 05-08-1950 Today's Date: 04/08/2020    History of Present Illness 70 yo male admitted with speech slurred, unable to move L arm, and L side tingling.  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 All education has been completed and the patient has no further questions. See below for any follow-up Occupational Therapy or equipment needs. OT to sign off. Thank you for referral.   Pt reports vision is off but unable to test any deficits currently. REcommend follow up for possible humphrey to further assess for deficits. Pt reports pending new glasses.     Follow Up Recommendations  No OT follow up    Equipment Recommendations  None recommended by OT    Recommendations for Other Services       Precautions / Restrictions Precautions Precautions: Fall      Mobility Bed Mobility Overal bed mobility: Modified Independent             General bed mobility comments: cues for safety with IV and lines    Transfers Overall transfer level: Needs assistance   Transfers: Sit to/from Stand Sit to Stand: Min guard         General transfer comment: able to static stand to pull up pants at EOB    Balance                                 Standardized Balance Assessment Standardized Balance Assessment : Dynamic Gait Index;Berg Balance Test Berg Balance Test Sit to Stand: Able to stand without using hands and stabilize independently Standing Unsupported: Able to stand safely 2 minutes Sitting with Back Unsupported but Feet Supported on Floor or Stool: Able to sit safely and securely 2 minutes Stand to Sit: Sits safely with minimal use of hands Transfers:  Able to transfer safely, minor use of hands Standing Unsupported with Eyes Closed: Able to stand 10 seconds safely Turn 360 Degrees: Able to turn 360 degrees safely in 4 seconds or less Dynamic Gait Index Level Surface: Normal Change in Gait Speed: Normal Gait with Horizontal Head Turns: Normal Gait with Vertical Head Turns: Normal Gait and Pivot Turn: Normal Step Over Obstacle: Normal Step Around Obstacles: Normal     ADL either performed or assessed with clinical judgement   ADL Overall ADL's : Needs assistance/impaired Eating/Feeding: Modified independent   Grooming: Wash/dry hands;Supervision/safety   Upper Body Bathing: Editor, commissioning Transfer: Supervision/safety       Tub/ Shower Transfer: Supervision/safety   Functional mobility during ADLs: Supervision/safety General ADL Comments: able to complete scanning task , clock drawing, and visual testing. pt appears close to baseline     Vision Baseline Vision/History: Wears glasses;Cataracts Wears Glasses: At all times Additional Comments: reports pending new pair of glasses     Perception     Praxis      Pertinent Vitals/Pain Pain Assessment: No/denies pain     Hand Dominance Right   Extremity/Trunk Assessment Upper Extremity Assessment Upper Extremity Assessment: Overall WFL for tasks assessed   Lower Extremity Assessment Lower Extremity Assessment: Overall WFL for tasks assessed   Cervical /  Trunk Assessment Cervical / Trunk Assessment: Normal   Communication Communication Communication: No difficulties   Cognition Arousal/Alertness: Awake/alert Behavior During Therapy: WFL for tasks assessed/performed                                       General Comments       Exercises     Shoulder Instructions      Home Living Family/patient expects to be discharged to:: Private residence Living Arrangements: Spouse/significant other                  Bathroom Shower/Tub: Producer, television/film/video: Standard     Home Equipment: None          Prior Functioning/Environment Level of Independence: Independent                 OT Problem List: Decreased activity tolerance;Impaired balance (sitting and/or standing);Decreased safety awareness;Impaired vision/perception      OT Treatment/Interventions:      OT Goals(Current goals can be found in the care plan section) Acute Rehab OT Goals Patient Stated Goal: to get some sleep OT Goal Formulation: All assessment and education complete, DC therapy Potential to Achieve Goals: Good  OT Frequency:     Barriers to D/C:            Co-evaluation              AM-PAC OT "6 Clicks" Daily Activity     Outcome Measure Help from another person eating meals?: None Help from another person taking care of personal grooming?: None Help from another person toileting, which includes using toliet, bedpan, or urinal?: None Help from another person bathing (including washing, rinsing, drying)?: None Help from another person to put on and taking off regular upper body clothing?: None Help from another person to put on and taking off regular lower body clothing?: None 6 Click Score: 24   End of Session Nurse Communication: Mobility status;Precautions  Activity Tolerance: Patient tolerated treatment well Patient left: in chair;with call bell/phone within reach;with chair alarm set  OT Visit Diagnosis: Unsteadiness on feet (R26.81)                Time: 6195-0932 OT Time Calculation (min): 20 min Charges:  OT General Charges $OT Visit: 1 Visit OT Evaluation $OT Eval Moderate Complexity: 1 Mod   Brynn, OTR/L  Acute Rehabilitation Services Pager: [phone] Office: [phone] .   Mateo Flow 04/08/2020, 9:59 AM

## 2020-04-08 NOTE — Evaluation (Signed)
Physical Therapy Evaluation Patient Details Name: Christopher Kirk MRN: [id number] DOB: [birth year] Today's Date: 04/08/2020   History of Present Illness  70 yo male admitted with speech slurred, unable to move L arm, and L side tingling.  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 Education complete and no further acute PT needs warranted. No PT follow up recommended at this time.     Follow Up Recommendations No PT follow up    Equipment Recommendations  None recommended by PT    Recommendations for Other Services       Precautions / Restrictions Precautions Precautions: Fall      Mobility  Bed Mobility Overal bed mobility: Modified Independent             General bed mobility comments: cues for safety with IV and lines    Transfers Overall transfer level: Modified independent   Transfers: Sit to/from Stand Sit to Stand: Min guard         General transfer comment: able to static stand to pull up pants at EOB  Ambulation/Gait Ambulation/Gait assistance: Modified independent (Device/Increase time) Gait Distance (Feet): 200 Feet Assistive device: None Gait Pattern/deviations: WFL(Within Functional Limits)     General Gait Details: able to perform standing march in simulation for stair training as patient began to fatigue and request to return to room  Stairs            Wheelchair Mobility    Modified Rankin (Stroke Patients Only)       Balance Overall balance assessment: Mild deficits observed, not formally tested                               Standardized Balance Assessment Standardized Balance Assessment : Dynamic Gait Index;Berg Balance Test Berg Balance Test Sit to Stand: Able to stand without using hands and  stabilize independently Standing Unsupported: Able to stand safely 2 minutes Sitting with Back Unsupported but Feet Supported on Floor or Stool: Able to sit safely and securely 2 minutes Stand to Sit: Sits safely with minimal use of hands Transfers: Able to transfer safely, minor use of hands Standing Unsupported with Eyes Closed: Able to stand 10 seconds safely Turn 360 Degrees: Able to turn 360 degrees safely in 4 seconds or less Dynamic Gait Index Level Surface: Normal Change in Gait Speed: Normal Gait with Horizontal Head Turns: Normal Gait with Vertical Head Turns: Normal Gait and Pivot Turn: Normal Step Over Obstacle: Normal Step Around Obstacles: Normal       Pertinent Vitals/Pain Pain Assessment: No/denies pain    Home Living Family/patient expects to be discharged to:: Private residence Living Arrangements: Spouse/significant other Available Help at Discharge: Family Type of Home: House         Home Equipment: None      Prior Function Level of Independence: Independent               Hand Dominance   Dominant Hand: Right    Extremity/Trunk Assessment   Upper Extremity Assessment Upper Extremity Assessment: Overall WFL for tasks assessed    Lower Extremity Assessment Lower Extremity Assessment: Overall WFL for tasks assessed    Cervical / Trunk Assessment Cervical / Trunk Assessment: Normal  Communication   Communication:  No difficulties  Cognition Arousal/Alertness: Awake/alert Behavior During Therapy: WFL for tasks assessed/performed Overall Cognitive Status: Within Functional Limits for tasks assessed                                        General Comments      Exercises     Assessment/Plan    PT Assessment Patent does not need any further PT services  PT Problem List         PT Treatment Interventions      PT Goals (Current goals can be found in the Care Plan section)  Acute Rehab PT Goals Patient Stated  Goal: to get some sleep PT Goal Formulation: With patient    Frequency     Barriers to discharge        Co-evaluation               AM-PAC PT "6 Clicks" Mobility  Outcome Measure Help needed turning from your back to your side while in a flat bed without using bedrails?: None Help needed moving from lying on your back to sitting on the side of a flat bed without using bedrails?: None Help needed moving to and from a bed to a chair (including a wheelchair)?: None Help needed standing up from a chair using your arms (e.g., wheelchair or bedside chair)?: None Help needed to walk in hospital room?: None Help needed climbing 3-5 steps with a railing? : None 6 Click Score: 24    End of Session   Activity Tolerance: Patient tolerated treatment well Patient left: in bed;with call bell/phone within reach;with bed alarm set Nurse Communication: Mobility status PT Visit Diagnosis: Unsteadiness on feet (R26.81);Muscle weakness (generalized) (M62.81)    Time: 4287-6811 PT Time Calculation (min) (ACUTE ONLY): 11 min   Charges:   PT Evaluation $PT Eval Low Complexity: 1 Low          Adhya Cocco A. Dan Humphreys PT, DPT Acute Rehabilitation Services Pager [phone] Office [phone]   Viviann Spare 04/08/2020, 11:10 AM

## 2020-04-08 NOTE — Care Management Obs Status (Signed)
MEDICARE OBSERVATION STATUS NOTIFICATION   Patient Details  Name: Christopher Kirk MRN: [id number] Date of Birth: Feb 12, 1950   Medicare Observation Status Notification Given:  Yes    Lawerance Sabal, RN 04/08/2020, 4:53 PM

## 2020-04-08 NOTE — Care Management CC44 (Signed)
Condition Code 44 Documentation Completed  Patient Details  Name: Christopher Kirk MRN: [id number] Date of Birth: [birth year]   Condition Code 44 given:  Yes Patient signature on Condition Code 44 notice:  Yes Documentation of 2 MD's agreement:  Yes Code 44 added to claim:  Yes    Lawerance Sabal, RN 04/08/2020, 4:53 PM

## 2020-04-08 NOTE — Discharge Summary (Signed)
Physician Discharge Summary  Christopher Kirk QZE:[id number] DOB: [birth year] DOA: 04/07/2020  PCP: Pcp, No  Admit date: 04/07/2020 Discharge date: 04/08/2020  Time spent: 40 minutes  Recommendations for Outpatient Follow-up:  1. Follow outpatient CBC/CMP 2. Follow with neurology outpatient 3. DAPT x3 months, then aspirin alone 4. Needs FNA outpatient 5. Follow blood pressure outpatient  6. Follow bg control on metformin outpatient   7. If memory issues outpatient, consider outpatient speech    Discharge Diagnoses:  Principal Problem:   Acute CVA (cerebrovascular accident) [facility]) Active Problems:   Cerebral thrombosis with cerebral infarction   Dyslipidemia   Hypertension   Diabetes mellitus without complication (HCC)   GERD (gastroesophageal reflux disease)   Discharge Condition: stable   Diet recommendation: heart healthy/diabetic  Filed Weights   04/07/20 0934  Weight: 81.6 kg    History of present illness:  Christopher Kirk is a 70 y.o. male with medical history significant of HTN; HLD; and DM presenting with stroke-like symptoms.  He reports that he "felt squirrelly".  Speech slurred, unable to move L arm, tingling on L side.  He has been sick for over a week.  He thought he had COVID - couldn't keep anything down, had a headache.  He never lost his sense of smell or taste or had a fever.  He had L-sided weakness last night arm > leg, lasted 5-10 minutes and resolved completely.  He had recurrent symptoms this AM, unable to move his arm > leg on the L, couldn't talk.  Symptoms on day of admission lasted about 45-60 minutes.  He still feels a little like he is in a fog, hard to focus on his phone.  No dysarthria.    He does not think he had dysphagia at all.  He didn't take his medications due to inability to keep anything down for about a week and thinks this led to symptoms.  He was found to have a stroke on MRI.  Seen by neurology who recommended DAPT x3 months followed by  aspirin alone, lipitor 80 mg.    See below for additional details   Hospital Course:  Acute Stroke - MRI with multiple scattered small acute infarctions within both cerebellar hemispheres.  Larger confluent infarction affecting the L occipital lobe mearsuing up to 3 cm in size.  No evidence of hemorrhage or mass effect.  Abnormal flow in the posterior circulation. L vertebral artery is probably a tiny vessel that terminates in PICA.  There is an abnormal flow appearance of the dominant right vertebral artery and proximal basilar artery.  - CTA head/neck with hypodense areas within the L occipital obe and bilateral cerebellar hemispheres c/w acute infarct seen on recent MRI.  Hypodense area in anterior aspect of the medulla does not have correspoding diffusion restriction on MRI (new infarct vs artifcat).  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  L vertebral artery is hypoplastic and ends in PICA.  Basilar artery is patent, likely filled by retrograde flow from right posterior comunicating artery, with diminutive caliber throughout its course which may be constitutional vs related to decreased flow.  Luminal irregularity in proximal basilar artery suggestive of intracranial atherosclerotic disease.  Diffuse luminal irregularity of bilateral posterior cerebral arteries is also suggestive of intracranial atherosclerotic disease with focal area of moderate stenosis at the bilateral P2 segments.  Mild atherosclerotic changes of the carotid bigurcations without evidence of stenosis or occlusion.  - ASA, plavix x3 months followed by ASA alone -  atorvastatin 80 mg - A1c 6.1, LDL 81 - echo with EF 60-65%, grade 1 diastolic dysfunction (see report) - PT/OT/SLP without follow up recommendation (consider speech outpatient if memory not at baseline after discharge)  HTN -Allow permissive HTNfor now -Treat BP only if >220/120, and then with goal of 15% reduction -resume home meds over next couple  of days  HLD -lipitor 80  DM -continue metformin at dischage  GERD -He reports taking his wife's medication for this issue  Thyromegaly - 4.3 cm nodule -> nonemergent outpatient FNA recommended - TSH wnl  Procedures: Echo IMPRESSIONS    1. Left ventricular ejection fraction, by estimation, is 60 to 65%. The  left ventricle has normal function. The left ventricle has no regional  wall motion abnormalities. There is mild concentric left ventricular  hypertrophy. Left ventricular diastolic  parameters are consistent with Grade I diastolic dysfunction (impaired  relaxation).  2. Right ventricular systolic function is normal. The right ventricular  size is normal. Tricuspid regurgitation signal is inadequate for assessing  PA pressure.  3. The mitral valve is grossly normal. No evidence of mitral valve  regurgitation. No evidence of mitral stenosis.  4. The aortic valve is tricuspid. There is mild calcification of the  aortic valve. There is mild thickening of the aortic valve. Aortic valve  regurgitation is not visualized. Mild to moderate aortic valve  sclerosis/calcification is present, without any  evidence of aortic stenosis.  5. The inferior vena cava is normal in size with greater than 50%  respiratory variability, suggesting right atrial pressure of 3 mmHg.   Conclusion(s)/Recommendation(s): No intracardiac source of embolism  detected on this transthoracic study. A transesophageal echocardiogram is  recommended to exclude cardiac source of embolism if clinically indicated.   Consultations:  neurology  Discharge Exam: Vitals:   04/08/20 1245 04/08/20 1541  BP: (!) 162/88 (!) 153/97  Pulse: 80 76  Resp: 18 16  Temp: 97.9 F (36.6 C) 97.7 F (36.5 C)  SpO2: 97% 95%   Symptoms resolved He's feeling back to hs baseline  General: No acute distress. Cardiovascular: Heart sounds show a regular rate, and rhythm Lungs: Clear to auscultation  bilaterally Abdomen: Soft, nontender, nondistended Neurological: Alert and oriented 3. Moves all extremities 4 with equal strength. Cranial nerves II through XII intact.  FNF, heel to shin intact. Skin: Warm and dry. No rashes or lesions. Extremities: No clubbing or cyanosis. No edema.   Discharge Instructions   Discharge Instructions    Call MD for:  difficulty breathing, headache or visual disturbances   Complete by: As directed    Call MD for:  extreme fatigue   Complete by: As directed    Call MD for:  hives   Complete by: As directed    Call MD for:  persistant dizziness or light-headedness   Complete by: As directed    Call MD for:  persistant nausea and vomiting   Complete by: As directed    Call MD for:  redness, tenderness, or signs of infection (pain, swelling, redness, odor or green/yellow discharge around incision site)   Complete by: As directed    Call MD for:  severe uncontrolled pain   Complete by: As directed    Call MD for:  temperature >100.4   Complete by: As directed    Diet - low sodium heart healthy   Complete by: As directed    Diet Carb Modified   Complete by: As directed    Discharge instructions  Complete by: As directed    You were seen for a stroke.  Your symptoms have resolved.    You've been seen by neurology.  We're recommending outpatient follow up with neurology.  You should take aspirin and plavix for 3 months, then transition to aspirin alone.  We've changed your statin to lipitor 80 mg daily.    Resume your blood pressure medicines gradually over the next few days (as noted in your med list - resume your amlodipine tomorrow 3/13 and resume your coreg and irbesartan on Monday, 3/14).    If you have memory issues, you may need to see the speech therapists outpatient.   You have a thyroid nodule, you'll need an FNA as an outpatient.   Return for new, recurrent, or worsening issues.    Please ask your PCP to request records from this  hospitalization so they know what was done and what the next steps will be.   Increase activity slowly   Complete by: As directed      Allergies as of 04/08/2020   No Known Allergies     Medication List    STOP taking these medications   pravastatin 40 MG tablet Commonly known as: PRAVACHOL     TAKE these medications   amLODipine 10 MG tablet Commonly known as: NORVASC Take 1 tablet (10 mg total) by mouth daily. Start taking on: April 09, 2020   aspirin 81 MG EC tablet Take 1 tablet (81 mg total) by mouth daily. Swallow whole. Start taking on: April 09, 2020   atorvastatin 80 MG tablet Commonly known as: LIPITOR Take 1 tablet (80 mg total) by mouth daily. Start taking on: April 09, 2020   carvedilol 6.25 MG tablet Commonly known as: COREG Take 1 tablet (6.25 mg total) by mouth 2 (two) times daily. Start taking on: April 10, 2020 What changed: These instructions start on April 10, 2020. If you are unsure what to do until then, ask your doctor or other care provider.   clopidogrel 75 MG tablet Commonly known as: PLAVIX Take 1 tablet (75 mg total) by mouth daily. Start taking on: April 09, 2020   irbesartan 300 MG tablet Commonly known as: AVAPRO Take 1 tablet (300 mg total) by mouth daily. Start taking on: April 10, 2020 What changed: These instructions start on April 10, 2020. If you are unsure what to do until then, ask your doctor or other care provider.   metFORMIN 1000 MG tablet Commonly known as: GLUCOPHAGE Take 1,000 mg by mouth 2 (two) times daily.      No Known Allergies    The results of significant diagnostics from this hospitalization (including imaging, microbiology, ancillary and laboratory) are listed below for reference.    Significant Diagnostic Studies: CT ANGIO HEAD W OR WO CONTRAST  Result Date: 04/07/2020 CLINICAL DATA:  Neuro deficit, acute, stroke suspected. EXAM: CT ANGIOGRAPHY HEAD AND NECK TECHNIQUE: Multidetector CT imaging of the  head and neck was performed using the standard protocol during bolus administration of intravenous contrast. Multiplanar CT image reconstructions and MIPs were obtained to evaluate the vascular anatomy. Carotid stenosis measurements (when applicable) are obtained utilizing NASCET criteria, using the distal internal carotid diameter as the denominator. CONTRAST:  75mL OMNIPAQUE IOHEXOL 350 MG/ML SOLN COMPARISON:  MRI of the brain April 07, 2020 FINDINGS: CT HEAD FINDINGS Brain: Hypodense areas within the left occipital lobe and bilateral cerebellar hemispheres consistent with acute infarct seen on recent MRI of the brain. A hypodense area in  the anterior aspect of the medulla does not have a corresponding diffusion restriction on recent MRI., may represent new infarct versus artifact. No hemorrhage, hydrocephalus, mass or midline shift. Vascular: No hyperdense vessel. Calcified plaques in the right vertebral artery. Skull: Normal. Negative for fracture or focal lesion. Sinuses: Mucosal thickening scattered throughout the paranasal sinuses. Orbits: No acute finding. Review of the MIP images confirms the above findings CTA NECK FINDINGS Aortic arch: Standard branching. Imaged portion shows no evidence of aneurysm or dissection. Mild atherosclerotic changes of the aortic arch. No significant stenosis of the major arch vessel origins. Right carotid system: Mild atherosclerotic changes of the right carotid bifurcation. No evidence of dissection, stenosis (50% or greater) or occlusion. Left carotid system: Mild atherosclerotic changes of the left carotid bifurcation. No evidence of dissection, stenosis (50% or greater) or occlusion. Vertebral arteries: Atherosclerotic changes at the origin of the right vertebral artery and along the V1 and proximal V2 segments with multifocal area of severe stenosis. Distal V2 segment and V3 segment have normal caliber. The right vertebral artery is dominant. The left vertebral artery is  hypoplastic with small caliber throughout its entire course ending in PICA. Skeleton: Negative. Other neck: Enlarged multinodular thyroid. Upper chest: Centrilobular emphysema. Review of the MIP images confirms the above findings CTA HEAD FINDINGS Anterior circulation: No significant stenosis, proximal occlusion, aneurysm, or vascular malformation. 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 The left vertebral artery is hypoplastic and ends in PICA. The basilar artery is patent, likely filled by retrograde flow from right posterior communicating artery, with diminutive caliber throughout its course which may be constitutional versus related to decreased flow. There is luminal irregularity with mild stenosis at the proximal segment suggestive of atherosclerotic disease. Diffuse luminal irregularity of the bilateral posterior cerebral arteries is also suggestive of intracranial atherosclerotic disease with focal area of moderate stenosis at the bilateral P2 segments. Venous sinuses: Poorly opacified due to contrast bolus timing. Anatomic variants: Left vertebral artery ending in PICA. Review of the MIP images confirms the above findings IMPRESSION: 1. Hypodense areas within the left occipital lobe and bilateral cerebellar hemispheres consistent with acute infarct seen on recent MRI of the brain. A hypodense area in the anterior aspect of the medulla does not have a corresponding diffusion restriction on recent MRI, may represent new infarct versus artifact. Correlate clinically. 2. 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 The left vertebral artery is hypoplastic and ends in PICA. 3. The basilar artery is patent, likely filled by retrograde flow from right posterior communicating artery, with diminutive caliber throughout its course which may be constitutional versus related to decreased flow. Luminal irregularity in the proximal basilar artery suggestive of  intracranial atherosclerotic disease. 4. Diffuse luminal irregularity of the bilateral posterior cerebral arteries is also suggestive of intracranial atherosclerotic disease with focal area of moderate stenosis at the bilateral P2 segments. 5. Mild atherosclerotic changes of the carotid bifurcations without evidence of stenosis (50% or greater) or occlusion. 6. Enlarged multinodular thyroid thyroid gland. Recommend thyroid ultrasound for further evaluation. 7. Emphysema and aortic atherosclerosis. These results were communicated via secure text paging at the time of interpretation on 04/07/2020 at 12:39 pm to provider [facility] , who acknowledged these results. Aortic Atherosclerosis (ICD10-I70.0) and Emphysema (ICD10-J43.9). Electronically Signed   By: Baldemar Lenis M.D.   On: 04/07/2020 12:55   CT ANGIO NECK W OR WO CONTRAST  Result Date: 04/07/2020 CLINICAL DATA:  Neuro  deficit, acute, stroke suspected. EXAM: CT ANGIOGRAPHY HEAD AND NECK TECHNIQUE: Multidetector CT imaging of the head and neck was performed using the standard protocol during bolus administration of intravenous contrast. Multiplanar CT image reconstructions and MIPs were obtained to evaluate the vascular anatomy. Carotid stenosis measurements (when applicable) are obtained utilizing NASCET criteria, using the distal internal carotid diameter as the denominator. CONTRAST:  42mL OMNIPAQUE IOHEXOL 350 MG/ML SOLN COMPARISON:  MRI of the brain April 07, 2020 FINDINGS: CT HEAD FINDINGS Brain: Hypodense areas within the left occipital lobe and bilateral cerebellar hemispheres consistent with acute infarct seen on recent MRI of the brain. A hypodense area in the anterior aspect of the medulla does not have a corresponding diffusion restriction on recent MRI., may represent new infarct versus artifact. No hemorrhage, hydrocephalus, mass or midline shift. Vascular: No hyperdense vessel. Calcified plaques in the right vertebral  artery. Skull: Normal. Negative for fracture or focal lesion. Sinuses: Mucosal thickening scattered throughout the paranasal sinuses. Orbits: No acute finding. Review of the MIP images confirms the above findings CTA NECK FINDINGS Aortic arch: Standard branching. Imaged portion shows no evidence of aneurysm or dissection. Mild atherosclerotic changes of the aortic arch. No significant stenosis of the major arch vessel origins. Right carotid system: Mild atherosclerotic changes of the right carotid bifurcation. No evidence of dissection, stenosis (50% or greater) or occlusion. Left carotid system: Mild atherosclerotic changes of the left carotid bifurcation. No evidence of dissection, stenosis (50% or greater) or occlusion. Vertebral arteries: Atherosclerotic changes at the origin of the right vertebral artery and along the V1 and proximal V2 segments with multifocal area of severe stenosis. Distal V2 segment and V3 segment have normal caliber. The right vertebral artery is dominant. The left vertebral artery is hypoplastic with small caliber throughout its entire course ending in PICA. Skeleton: Negative. Other neck: Enlarged multinodular thyroid. Upper chest: Centrilobular emphysema. Review of the MIP images confirms the above findings CTA HEAD FINDINGS Anterior circulation: No significant stenosis, proximal occlusion, aneurysm, or vascular malformation. 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 The left vertebral artery is hypoplastic and ends in PICA. The basilar artery is patent, likely filled by retrograde flow from right posterior communicating artery, with diminutive caliber throughout its course which may be constitutional versus related to decreased flow. There is luminal irregularity with mild stenosis at the proximal segment suggestive of atherosclerotic disease. Diffuse luminal irregularity of the bilateral posterior cerebral arteries is also suggestive of  intracranial atherosclerotic disease with focal area of moderate stenosis at the bilateral P2 segments. Venous sinuses: Poorly opacified due to contrast bolus timing. Anatomic variants: Left vertebral artery ending in PICA. Review of the MIP images confirms the above findings IMPRESSION: 1. Hypodense areas within the left occipital lobe and bilateral cerebellar hemispheres consistent with acute infarct seen on recent MRI of the brain. A hypodense area in the anterior aspect of the medulla does not have a corresponding diffusion restriction on recent MRI, may represent new infarct versus artifact. Correlate clinically. 2. 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 The left vertebral artery is hypoplastic and ends in PICA. 3. The basilar artery is patent, likely filled by retrograde flow from right posterior communicating artery, with diminutive caliber throughout its course which may be constitutional versus related to decreased flow. Luminal irregularity in the proximal basilar artery suggestive of intracranial atherosclerotic disease. 4. Diffuse luminal irregularity of the bilateral posterior cerebral arteries is also suggestive of intracranial atherosclerotic disease  with focal area of moderate stenosis at the bilateral P2 segments. 5. Mild atherosclerotic changes of the carotid bifurcations without evidence of stenosis (50% or greater) or occlusion. 6. Enlarged multinodular thyroid thyroid gland. Recommend thyroid ultrasound for further evaluation. 7. Emphysema and aortic atherosclerosis. These results were communicated via secure text paging at the time of interpretation on 04/07/2020 at 12:39 pm to provider [facility] , who acknowledged these results. Aortic Atherosclerosis (ICD10-I70.0) and Emphysema (ICD10-J43.9). Electronically Signed   By: Baldemar Lenis M.D.   On: 04/07/2020 12:55   MR BRAIN WO CONTRAST  Result Date: 04/07/2020 CLINICAL DATA:  Diabetes and  hypertension. Acute stroke presentation. EXAM: MRI HEAD WITHOUT CONTRAST TECHNIQUE: Multiplanar, multiecho pulse sequences of the brain and surrounding structures were obtained without intravenous contrast. COMPARISON:  None. FINDINGS: Brain: Diffusion imaging shows multiple scattered small acute infarctions within both cerebellar hemispheres. There is a larger confluent infarction affecting the left occipital lobe measuring up to 3 cm in size. No evidence of acute anterior circulation insult. No evidence of hemorrhage or significant swelling/mass effect. Elsewhere, there is mild age related volume loss with minimal small vessel change of the cerebral hemispheric white matter. No hydrocephalus. Vascular: Flow is present in both carotid arteries. There is abnormal flow in the posterior circulation. Left vertebral artery is probably a tiny vessel that terminates in PICA. Dominant right vertebral artery shows atherosclerosis and does not show abnormal flow. There is also not normal flow in the proximal basilar artery. Skull and upper cervical spine: Negative Sinuses/Orbits: Clear/normal Other: None IMPRESSION: 1. Multiple scattered small acute infarctions within both cerebellar hemispheres. Larger confluent infarction affecting the left occipital lobe measuring up to 3 cm in size. No evidence of hemorrhage or mass effect. 2. Abnormal flow in the posterior circulation. Left vertebral artery is probably a tiny vessel that terminates in PICA. There is an abnormal flow appearance of the dominant right vertebral artery and proximal basilar artery. Electronically Signed   By: Paulina Fusi M.D.   On: 04/07/2020 11:45   ECHOCARDIOGRAM COMPLETE  Result Date: 04/08/2020    ECHOCARDIOGRAM REPORT   Patient Name:   Christopher Kirk Date of Exam: 04/08/2020 Medical Rec #:  [id number]     Height:       70.0 in Accession #:    [id number]    Weight:       180.0 lb Date of Birth:  16-Jun-1950    BSA:          1.996 m Patient Age:    69  years      BP:           162/88 mmHg Patient Gender: M             HR:           80 bpm. Exam Location:  Inpatient Procedure: 2D Echo, Cardiac Doppler and Color Doppler Indications:    CVA  History:        Patient has no prior history of Echocardiogram examinations.                 Risk Factors:Diabetes, Hypertension, Dyslipidemia and Current                 Smoker.  Sonographer:    Lavenia Atlas Referring Phys: [id number] JENNIFER YATES IMPRESSIONS  1. Left ventricular ejection fraction, by estimation, is 60 to 65%. The left ventricle has normal function. The left ventricle has no regional wall motion abnormalities. There is  mild concentric left ventricular hypertrophy. Left ventricular diastolic parameters are consistent with Grade I diastolic dysfunction (impaired relaxation).  2. Right ventricular systolic function is normal. The right ventricular size is normal. Tricuspid regurgitation signal is inadequate for assessing PA pressure.  3. The mitral valve is grossly normal. No evidence of mitral valve regurgitation. No evidence of mitral stenosis.  4. The aortic valve is tricuspid. There is mild calcification of the aortic valve. There is mild thickening of the aortic valve. Aortic valve regurgitation is not visualized. Mild to moderate aortic valve sclerosis/calcification is present, without any evidence of aortic stenosis.  5. The inferior vena cava is normal in size with greater than 50% respiratory variability, suggesting right atrial pressure of 3 mmHg. Conclusion(s)/Recommendation(s): No intracardiac source of embolism detected on this transthoracic study. A transesophageal echocardiogram is recommended to exclude cardiac source of embolism if clinically indicated. FINDINGS  Left Ventricle: Left ventricular ejection fraction, by estimation, is 60 to 65%. The left ventricle has normal function. The left ventricle has no regional wall motion abnormalities. The left ventricular internal cavity size was normal in  size. There is  mild concentric left ventricular hypertrophy. Left ventricular diastolic parameters are consistent with Grade I diastolic dysfunction (impaired relaxation). Right Ventricle: The right ventricular size is normal. No increase in right ventricular wall thickness. Right ventricular systolic function is normal. Tricuspid regurgitation signal is inadequate for assessing PA pressure. Left Atrium: Left atrial size was normal in size. Right Atrium: Right atrial size was normal in size. Pericardium: Trivial pericardial effusion is present. Presence of pericardial fat pad. Mitral Valve: The mitral valve is grossly normal. Mild mitral annular calcification. No evidence of mitral valve regurgitation. No evidence of mitral valve stenosis. Tricuspid Valve: The tricuspid valve is grossly normal. Tricuspid valve regurgitation is trivial. No evidence of tricuspid stenosis. Aortic Valve: The aortic valve is tricuspid. There is mild calcification of the aortic valve. There is mild thickening of the aortic valve. Aortic valve regurgitation is not visualized. Mild to moderate aortic valve sclerosis/calcification is present, without any evidence of aortic stenosis. Pulmonic Valve: The pulmonic valve was grossly normal. Pulmonic valve regurgitation is not visualized. No evidence of pulmonic stenosis. Aorta: The aortic root and ascending aorta are structurally normal, with no evidence of dilitation. Venous: The inferior vena cava is normal in size with greater than 50% respiratory variability, suggesting right atrial pressure of 3 mmHg. IAS/Shunts: The atrial septum is grossly normal.  LEFT VENTRICLE PLAX 2D LVIDd:         3.30 cm  Diastology LVIDs:         2.30 cm  LV e' medial:    6.85 cm/s LV PW:         1.30 cm  LV E/e' medial:  11.3 LV IVS:        1.30 cm  LV e' lateral:   6.20 cm/s LVOT diam:     2.30 cm  LV E/e' lateral: 12.5 LV SV:         66 LV SV Index:   33 LVOT Area:     4.15 cm  RIGHT VENTRICLE RV Basal diam:   2.30 cm RV S prime:     8.92 cm/s TAPSE (M-mode): 3.0 cm LEFT ATRIUM             Index       RIGHT ATRIUM           Index LA diam:        3.90 cm  1.95 cm/m  RA Area:     10.10 cm LA Vol (A2C):   34.6 ml 17.34 ml/m RA Volume:   17.60 ml  8.82 ml/m LA Vol (A4C):   26.8 ml 13.43 ml/m LA Biplane Vol: 31.5 ml 15.78 ml/m  AORTIC VALVE LVOT Vmax:   83.30 cm/s LVOT Vmean:  59.400 cm/s LVOT VTI:    0.160 m  AORTA Ao Root diam: 3.60 cm MITRAL VALVE MV Area (PHT): 3.50 cm     SHUNTS MV Decel Time: 217 msec     Systemic VTI:  0.16 m MV E velocity: 77.70 cm/s   Systemic Diam: 2.30 cm MV A velocity: 123.00 cm/s MV E/A ratio:  0.63 Lennie Odor MD Electronically signed by Lennie Odor MD Signature Date/Time: 04/08/2020/2:21:25 PM    Final    US THYROID  Result Date: 04/07/2020 CLINICAL DATA:  Thyroid nodule seen on CT EXAM: THYROID ULTRASOUND TECHNIQUE: Ultrasound examination of the thyroid gland and adjacent soft tissues was performed. COMPARISON:  None. FINDINGS: Parenchymal Echotexture: Mildly heterogenous Isthmus: 0.7 cm Right lobe: 5.9 x 2.4 x 1.6 cm Left lobe: Is 5.8 x 2.4 x 2 cm _________________________________________________________ Estimated total number of nodules >/= 1 cm: 1 Number of spongiform nodules >/=  2 cm not described below (TR1): 0 Number of mixed cystic and solid nodules >/= 1.5 cm not described below (TR2): 0 _________________________________________________________ Nodule # 1: Location: Right; Mid Maximum size: 0.6 cm; Other 2 dimensions: 0.6 x 0.5 cm Composition: cannot determine (2) Echogenicity: cannot determine (1) Shape: not taller-than-wide (0) Margins: cannot determine (0) Echogenic foci: peripheral calcifications (2) ACR TI-RADS total points: 5. ACR TI-RADS risk category: TR4 (4-6 points). ACR TI-RADS recommendations: Given size (<0.9 cm) and appearance, this nodule does NOT meet TI-RADS criteria for biopsy or dedicated follow-up.  _________________________________________________________ Nodule # 2: Location: Isthmus; Inferior Maximum size: 4.3 cm; Other 2 dimensions: 4.1 x 2.2 cm Composition: solid/almost completely solid (2) Echogenicity: isoechoic (1) Shape: not taller-than-wide (0) Margins: ill-defined (0) Echogenic foci: macrocalcifications (1) ACR TI-RADS total points: 4. ACR TI-RADS risk category: TR4 (4-6 points). ACR TI-RADS recommendations: **Given size (>/= 1.5 cm) and appearance, fine needle aspiration of this moderately suspicious nodule should be considered based on TI-RADS criteria. _________________________________________________________ IMPRESSION: There is a 4.3 cm TR4 thyroid nodule in the patient's isthmus. Nonemergent outpatient fine-needle aspiration is recommended for this thyroid nodule. The above is in keeping with the ACR TI-RADS recommendations - J Am Coll Radiol 2017;14:587-595. Electronically Signed   By: Katherine Mantle M.D.   On: 04/07/2020 17:42    Microbiology: Recent Results (from the past 240 hour(s))  Resp Panel by RT-PCR (Flu A&B, Covid) Nasopharyngeal Swab     Status: None   Collection Time: 04/07/20 10:05 AM   Specimen: Nasopharyngeal Swab; Nasopharyngeal(NP) swabs in vial transport medium  Result Value Ref Range Status   SARS Coronavirus 2 by RT PCR NEGATIVE NEGATIVE Final    Comment: (NOTE) SARS-CoV-2 target nucleic acids are NOT DETECTED.  The SARS-CoV-2 RNA is generally detectable in upper respiratory specimens during the acute phase of infection. The lowest concentration of SARS-CoV-2 viral copies this assay can detect is 138 copies/mL. A negative result does not preclude SARS-Cov-2 infection and should not be used as the sole basis for treatment or other patient management decisions. A negative result may occur with  improper specimen collection/handling, submission of specimen other than nasopharyngeal swab, presence of viral mutation(s) within the areas targeted by this  assay, and inadequate number of viral  copies(<138 copies/mL). A negative result must be combined with clinical observations, patient history, and epidemiological information. The expected result is Negative.  Fact Sheet for Patients:  BloggerCourse.com  Fact Sheet for Healthcare Providers:  SeriousBroker.it  This test is no t yet approved or cleared by the [place] FDA and  has been authorized for detection and/or diagnosis of SARS-CoV-2 by FDA under an Emergency Use Authorization (EUA). This EUA will remain  in effect (meaning this test can be used) for the duration of the COVID-19 declaration under Section 564(b)(1) of the Act, 21 U.S.C.section 360bbb-3(b)(1), unless the authorization is terminated  or revoked sooner.       Influenza A by PCR NEGATIVE NEGATIVE Final   Influenza B by PCR NEGATIVE NEGATIVE Final    Comment: (NOTE) The Xpert Xpress SARS-CoV-2/FLU/RSV plus assay is intended as an aid in the diagnosis of influenza from Nasopharyngeal swab specimens and should not be used as a sole basis for treatment. Nasal washings and aspirates are unacceptable for Xpert Xpress SARS-CoV-2/FLU/RSV testing.  Fact Sheet for Patients: BloggerCourse.com  Fact Sheet for Healthcare Providers: SeriousBroker.it  This test is not yet approved or cleared by the [place] FDA and has been authorized for detection and/or diagnosis of SARS-CoV-2 by FDA under an Emergency Use Authorization (EUA). This EUA will remain in effect (meaning this test can be used) for the duration of the COVID-19 declaration under Section 564(b)(1) of the Act, 21 U.S.C. section 360bbb-3(b)(1), unless the authorization is terminated or revoked.  Performed at [facility], [address] [place], [place] [address]      Labs: Basic Metabolic Panel: Recent Labs  Lab 04/07/20 1005  NA 135  136   K 4.2  6.1*  CL 103  102  CO2 23  GLUCOSE 198*  189*  BUN 12  17  CREATININE 0.76  0.60*  CALCIUM 9.2   Liver Function Tests: Recent Labs  Lab 04/07/20 1005  AST 22  ALT 29  ALKPHOS 68  BILITOT 1.1  PROT 6.5  ALBUMIN 3.8   No results for input(s): LIPASE, AMYLASE in the last 168 hours. No results for input(s): AMMONIA in the last 168 hours. CBC: Recent Labs  Lab 04/07/20 1005  WBC 10.5  NEUTROABS 7.4  HGB 15.4  15.0  HCT 43.2  44.0  MCV 89.8  PLT 260   Cardiac Enzymes: No results for input(s): CKTOTAL, CKMB, CKMBINDEX, TROPONINI in the last 168 hours. BNP: BNP (last 3 results) No results for input(s): BNP in the last 8760 hours.  ProBNP (last 3 results) No results for input(s): PROBNP in the last 8760 hours.  CBG: Recent Labs  Lab 04/07/20 1817 04/07/20 2326 04/08/20 0655 04/08/20 1246 04/08/20 1615  GLUCAP 164* 135* 139* 204* 169*       Signed:  Lacretia Nicks MD.  [facility] 04/08/2020, 4:17 PM

## 2020-04-08 NOTE — Progress Notes (Signed)
Pt is being discharged home. His wife is at bedside and will be transporting. IVs removed, tele removed. AVS and discharge education given to patient who verbalized understanding.

## 2020-04-08 NOTE — Evaluation (Signed)
Speech Language Pathology Evaluation Patient Details Name: Christopher Kirk MRN: [id number] DOB: 09-18-1950 Today's Date: 04/08/2020 Time: 4481-8563 SLP Time Calculation (min) (ACUTE ONLY): 15 min  Problem List:  Patient Active Problem List   Diagnosis Date Noted  . 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 Acute CVA (cerebrovascular accident) (HCC) 04/07/2020  . Dyslipidemia   . Hypertension   . Diabetes mellitus without complication (HCC)   . GERD (gastroesophageal reflux disease)    Past Medical History:  Past Medical History:  Diagnosis Date  . CVA (cerebral vascular accident) (HCC) 04/07/2020  . Diabetes mellitus without complication (HCC)   . Dyslipidemia   . GERD (gastroesophageal reflux disease)   . Hypertension    Past Surgical History:  Past Surgical History:  Procedure Laterality Date  . BACK SURGERY     HPI:  70 year old male admitted with transient left sided weakness that has now resolved.   Assessment / Plan / Recommendation Clinical Impression  Cognitive linguistic skills are WFL at this time. Possible mild short term memory impairement noted however patient with little sleep last night. Educated patient on possiblity of memory impairements with TIA. No skilled SLP indicated at this time however OP SLP services can be an option if patient feels that memory is not at baseline after d/c.                SLP Evaluation Cognition  Overall Cognitive Status: Within Functional Limits for tasks assessed Orientation Level: Oriented X4       Comprehension  Auditory Comprehension Overall Auditory Comprehension: Appears within functional limits for tasks assessed Visual Recognition/Discrimination Discrimination: Within Function Limits Reading Comprehension Reading Status: Within funtional limits    Expression Expression Primary Mode of Expression: Verbal Verbal Expression Overall Verbal Expression: Appears within functional limits for tasks  assessed Written Expression Dominant Hand: Right   Oral / Motor  Oral Motor/Sensory Function Overall Oral Motor/Sensory Function: Within functional limits Motor Speech Overall Motor Speech: Appears within functional limits for tasks assessed   GO            Christopher Lango MA, CCC-SLP           Christopher Kirk 04/08/2020, 10:11 AM

## 2020-04-08 NOTE — Progress Notes (Signed)
STROKE TEAM PROGRESS NOTE   INTERVAL HISTORY No acute events overnight.  Vital signs stable.  Neuro exam unchanged  Presented to ED with transient left sided weakness, right sided HA, right vision disturbance. Exam normal and symptoms resolved on arrival.   Currently denies headache. He feels his symptoms have remained resolved except "kaleidescope" multicolored floater in periphery of right vision noticed some of the time.   We discussed stroke diagnosis, ongoing work up and plan of care at this point. Questions answered.   Vitals:   04/07/20 2154 04/07/20 2323 04/08/20 0315 04/08/20 0741  BP: (!) 179/101 (!) 160/104 (!) 156/103 (!) 162/105  Pulse: 82 92 94 71  Resp: Temp: (!) 97.5 F (36.4 C) 97.9 F (36.6 C) 97.9 F (36.6 C) (!) 97.4 F (36.3 C)  TempSrc: Oral Oral Oral Oral  SpO2: 98% 97% 96% 97%  Weight:      Height:       CBC:  Recent Labs  Lab 04/07/20 1005  WBC 10.5  NEUTROABS 7.4  HGB 15.4  15.0  HCT 43.2  44.0  MCV 89.8  PLT 260   Basic Metabolic Panel:  Recent Labs  Lab 04/07/20 1005  NA 135  136  K 4.2  6.1*  CL 103  102  CO2 23  GLUCOSE 198*  189*  BUN 12  17  CREATININE 0.76  0.60*  CALCIUM 9.2   Lipid Panel:  Recent Labs  Lab 04/08/20 0236  CHOL 149  TRIG 192*  HDL 30*  CHOLHDL 5.0  VLDL 38  LDLCALC 81   HgbA1c:  Recent Labs  Lab 04/08/20 0236  HGBA1C 6.1*   Urine Drug Screen:  Recent Labs  Lab 04/07/20 1315  LABOPIA NONE DETECTED  COCAINSCRNUR NONE DETECTED  LABBENZ NONE DETECTED  AMPHETMU NONE DETECTED  THCU NONE DETECTED  LABBARB NONE DETECTED    Alcohol Level  Recent Labs  Lab 04/07/20 1005  ETH <10    IMAGING past 24 hours CT ANGIO HEAD W OR WO CONTRAST  Result Date: 04/07/2020 CLINICAL DATA:  Neuro deficit, acute, stroke suspected. EXAM: CT ANGIOGRAPHY HEAD AND NECK TECHNIQUE: Multidetector CT imaging of the head and neck was performed using the standard protocol during bolus  administration of intravenous contrast. Multiplanar CT image reconstructions and MIPs were obtained to evaluate the vascular anatomy. Carotid stenosis measurements (when applicable) are obtained utilizing NASCET criteria, using the distal internal carotid diameter as the denominator. CONTRAST:  75mL OMNIPAQUE IOHEXOL 350 MG/ML SOLN COMPARISON:  MRI of the brain April 07, 2020 FINDINGS: CT HEAD FINDINGS Brain: Hypodense areas within the left occipital lobe and bilateral cerebellar hemispheres consistent with acute infarct seen on recent MRI of the brain. A hypodense area in the anterior aspect of the medulla does not have a corresponding diffusion restriction on recent MRI., may represent new infarct versus artifact. No hemorrhage, hydrocephalus, mass or midline shift. Vascular: No hyperdense vessel. Calcified plaques in the right vertebral artery. Skull: Normal. Negative for fracture or focal lesion. Sinuses: Mucosal thickening scattered throughout the paranasal sinuses. Orbits: No acute finding. Review of the MIP images confirms the above findings CTA NECK FINDINGS Aortic arch: Standard branching. Imaged portion shows no evidence of aneurysm or dissection. Mild atherosclerotic changes of the aortic arch. No significant stenosis of the major arch vessel origins. Right carotid system: Mild atherosclerotic changes of the right carotid bifurcation. No evidence of dissection, stenosis (50% or greater) or occlusion. Left carotid system: Mild atherosclerotic changes  of the left carotid bifurcation. No evidence of dissection, stenosis (50% or greater) or occlusion. Vertebral arteries: Atherosclerotic changes at the origin of the right vertebral artery and along the V1 and proximal V2 segments with multifocal area of severe stenosis. Distal V2 segment and V3 segment have normal caliber. The right vertebral artery is dominant. The left vertebral artery is hypoplastic with small caliber throughout its entire course ending in  PICA. Skeleton: Negative. Other neck: Enlarged multinodular thyroid. Upper chest: Centrilobular emphysema. Review of the MIP images confirms the above findings CTA HEAD FINDINGS Anterior circulation: No significant stenosis, proximal occlusion, aneurysm, or vascular malformation. 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 The left vertebral artery is hypoplastic and ends in PICA. The basilar artery is patent, likely filled by retrograde flow from right posterior communicating artery, with diminutive caliber throughout its course which may be constitutional versus related to decreased flow. There is luminal irregularity with mild stenosis at the proximal segment suggestive of atherosclerotic disease. Diffuse luminal irregularity of the bilateral posterior cerebral arteries is also suggestive of intracranial atherosclerotic disease with focal area of moderate stenosis at the bilateral P2 segments. Venous sinuses: Poorly opacified due to contrast bolus timing. Anatomic variants: Left vertebral artery ending in PICA. Review of the MIP images confirms the above findings IMPRESSION: 1. Hypodense areas within the left occipital lobe and bilateral cerebellar hemispheres consistent with acute infarct seen on recent MRI of the brain. A hypodense area in the anterior aspect of the medulla does not have a corresponding diffusion restriction on recent MRI, may represent new infarct versus artifact. Correlate clinically. 2. 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 The left vertebral artery is hypoplastic and ends in PICA. 3. The basilar artery is patent, likely filled by retrograde flow from right posterior communicating artery, with diminutive caliber throughout its course which may be constitutional versus related to decreased flow. Luminal irregularity in the proximal basilar artery suggestive of intracranial atherosclerotic disease. 4. Diffuse luminal irregularity  of the bilateral posterior cerebral arteries is also suggestive of intracranial atherosclerotic disease with focal area of moderate stenosis at the bilateral P2 segments. 5. Mild atherosclerotic changes of the carotid bifurcations without evidence of stenosis (50% or greater) or occlusion. 6. Enlarged multinodular thyroid thyroid gland. Recommend thyroid ultrasound for further evaluation. 7. Emphysema and aortic atherosclerosis. These results were communicated via secure text paging at the time of interpretation on 04/07/2020 at 12:39 pm to provider [facility] , who acknowledged these results. Aortic Atherosclerosis (ICD10-I70.0) and Emphysema (ICD10-J43.9). Electronically Signed   By: Baldemar Lenis M.D.   On: 04/07/2020 12:55   CT ANGIO NECK W OR WO CONTRAST  Result Date: 04/07/2020 CLINICAL DATA:  Neuro deficit, acute, stroke suspected. EXAM: CT ANGIOGRAPHY HEAD AND NECK TECHNIQUE: Multidetector CT imaging of the head and neck was performed using the standard protocol during bolus administration of intravenous contrast. Multiplanar CT image reconstructions and MIPs were obtained to evaluate the vascular anatomy. Carotid stenosis measurements (when applicable) are obtained utilizing NASCET criteria, using the distal internal carotid diameter as the denominator. CONTRAST:  63mL OMNIPAQUE IOHEXOL 350 MG/ML SOLN COMPARISON:  MRI of the brain April 07, 2020 FINDINGS: CT HEAD FINDINGS Brain: Hypodense areas within the left occipital lobe and bilateral cerebellar hemispheres consistent with acute infarct seen on recent MRI of the brain. A hypodense area in the anterior aspect of the medulla does not have a corresponding diffusion restriction on recent MRI., may  represent new infarct versus artifact. No hemorrhage, hydrocephalus, mass or midline shift. Vascular: No hyperdense vessel. Calcified plaques in the right vertebral artery. Skull: Normal. Negative for fracture or focal lesion. Sinuses:  Mucosal thickening scattered throughout the paranasal sinuses. Orbits: No acute finding. Review of the MIP images confirms the above findings CTA NECK FINDINGS Aortic arch: Standard branching. Imaged portion shows no evidence of aneurysm or dissection. Mild atherosclerotic changes of the aortic arch. No significant stenosis of the major arch vessel origins. Right carotid system: Mild atherosclerotic changes of the right carotid bifurcation. No evidence of dissection, stenosis (50% or greater) or occlusion. Left carotid system: Mild atherosclerotic changes of the left carotid bifurcation. No evidence of dissection, stenosis (50% or greater) or occlusion. Vertebral arteries: Atherosclerotic changes at the origin of the right vertebral artery and along the V1 and proximal V2 segments with multifocal area of severe stenosis. Distal V2 segment and V3 segment have normal caliber. The right vertebral artery is dominant. The left vertebral artery is hypoplastic with small caliber throughout its entire course ending in PICA. Skeleton: Negative. Other neck: Enlarged multinodular thyroid. Upper chest: Centrilobular emphysema. Review of the MIP images confirms the above findings CTA HEAD FINDINGS Anterior circulation: No significant stenosis, proximal occlusion, aneurysm, or vascular malformation. 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 The left vertebral artery is hypoplastic and ends in PICA. The basilar artery is patent, likely filled by retrograde flow from right posterior communicating artery, with diminutive caliber throughout its course which may be constitutional versus related to decreased flow. There is luminal irregularity with mild stenosis at the proximal segment suggestive of atherosclerotic disease. Diffuse luminal irregularity of the bilateral posterior cerebral arteries is also suggestive of intracranial atherosclerotic disease with focal area of moderate stenosis at  the bilateral P2 segments. Venous sinuses: Poorly opacified due to contrast bolus timing. Anatomic variants: Left vertebral artery ending in PICA. Review of the MIP images confirms the above findings IMPRESSION: 1. Hypodense areas within the left occipital lobe and bilateral cerebellar hemispheres consistent with acute infarct seen on recent MRI of the brain. A hypodense area in the anterior aspect of the medulla does not have a corresponding diffusion restriction on recent MRI, may represent new infarct versus artifact. Correlate clinically. 2. 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 The left vertebral artery is hypoplastic and ends in PICA. 3. The basilar artery is patent, likely filled by retrograde flow from right posterior communicating artery, with diminutive caliber throughout its course which may be constitutional versus related to decreased flow. Luminal irregularity in the proximal basilar artery suggestive of intracranial atherosclerotic disease. 4. Diffuse luminal irregularity of the bilateral posterior cerebral arteries is also suggestive of intracranial atherosclerotic disease with focal area of moderate stenosis at the bilateral P2 segments. 5. Mild atherosclerotic changes of the carotid bifurcations without evidence of stenosis (50% or greater) or occlusion. 6. Enlarged multinodular thyroid thyroid gland. Recommend thyroid ultrasound for further evaluation. 7. Emphysema and aortic atherosclerosis. These results were communicated via secure text paging at the time of interpretation on 04/07/2020 at 12:39 pm to provider [facility] , who acknowledged these results. Aortic Atherosclerosis (ICD10-I70.0) and Emphysema (ICD10-J43.9). Electronically Signed   By: Baldemar Lenis M.D.   On: 04/07/2020 12:55   MR BRAIN WO CONTRAST  Result Date: 04/07/2020 CLINICAL DATA:  Diabetes and hypertension. Acute stroke presentation. EXAM: MRI HEAD WITHOUT CONTRAST TECHNIQUE:  Multiplanar, multiecho pulse sequences of the brain and  surrounding structures were obtained without intravenous contrast. COMPARISON:  None. FINDINGS: Brain: Diffusion imaging shows multiple scattered small acute infarctions within both cerebellar hemispheres. There is a larger confluent infarction affecting the left occipital lobe measuring up to 3 cm in size. No evidence of acute anterior circulation insult. No evidence of hemorrhage or significant swelling/mass effect. Elsewhere, there is mild age related volume loss with minimal small vessel change of the cerebral hemispheric white matter. No hydrocephalus. Vascular: Flow is present in both carotid arteries. There is abnormal flow in the posterior circulation. Left vertebral artery is probably a tiny vessel that terminates in PICA. Dominant right vertebral artery shows atherosclerosis and does not show abnormal flow. There is also not normal flow in the proximal basilar artery. Skull and upper cervical spine: Negative Sinuses/Orbits: Clear/normal Other: None IMPRESSION: 1. Multiple scattered small acute infarctions within both cerebellar hemispheres. Larger confluent infarction affecting the left occipital lobe measuring up to 3 cm in size. No evidence of hemorrhage or mass effect. 2. Abnormal flow in the posterior circulation. Left vertebral artery is probably a tiny vessel that terminates in PICA. There is an abnormal flow appearance of the dominant right vertebral artery and proximal basilar artery. Electronically Signed   By: Paulina Fusi M.D.   On: 04/07/2020 11:45   US THYROID  Result Date: 04/07/2020 CLINICAL DATA:  Thyroid nodule seen on CT EXAM: THYROID ULTRASOUND TECHNIQUE: Ultrasound examination of the thyroid gland and adjacent soft tissues was performed. COMPARISON:  None. FINDINGS: Parenchymal Echotexture: Mildly heterogenous Isthmus: 0.7 cm Right lobe: 5.9 x 2.4 x 1.6 cm Left lobe: Is 5.8 x 2.4 x 2 cm  _________________________________________________________ Estimated total number of nodules >/= 1 cm: 1 Number of spongiform nodules >/=  2 cm not described below (TR1): 0 Number of mixed cystic and solid nodules >/= 1.5 cm not described below (TR2): 0 _________________________________________________________ Nodule # 1: Location: Right; Mid Maximum size: 0.6 cm; Other 2 dimensions: 0.6 x 0.5 cm Composition: cannot determine (2) Echogenicity: cannot determine (1) Shape: not taller-than-wide (0) Margins: cannot determine (0) Echogenic foci: peripheral calcifications (2) ACR TI-RADS total points: 5. ACR TI-RADS risk category: TR4 (4-6 points). ACR TI-RADS recommendations: Given size (<0.9 cm) and appearance, this nodule does NOT meet TI-RADS criteria for biopsy or dedicated follow-up. _________________________________________________________ Nodule # 2: Location: Isthmus; Inferior Maximum size: 4.3 cm; Other 2 dimensions: 4.1 x 2.2 cm Composition: solid/almost completely solid (2) Echogenicity: isoechoic (1) Shape: not taller-than-wide (0) Margins: ill-defined (0) Echogenic foci: macrocalcifications (1) ACR TI-RADS total points: 4. ACR TI-RADS risk category: TR4 (4-6 points). ACR TI-RADS recommendations: **Given size (>/= 1.5 cm) and appearance, fine needle aspiration of this moderately suspicious nodule should be considered based on TI-RADS criteria. _________________________________________________________ IMPRESSION: There is a 4.3 cm TR4 thyroid nodule in the patient's isthmus. Nonemergent outpatient fine-needle aspiration is recommended for this thyroid nodule. The above is in keeping with the ACR TI-RADS recommendations - J Am Coll Radiol 2017;14:587-595. Electronically Signed   By: Katherine Mantle M.D.   On: 04/07/2020 17:42   PHYSICAL EXAM Physical Exam  Constitutional: Appears well-developed and well-nourished. Sitting up in chair at bedside in NAD.  Psych: Calm, cooperative, pleasant and in good  spirits Eyes: No scleral injection HENT: No oropharyngeal obstruction.  MSK: no joint deformities.  Cardiovascular: Normal rate and regular rhythm.  Respiratory: Effort normal, non-labored breathing GI: Soft.  No distension. There is no tenderness.  Skin: Warm dry and intact visible skin  Neuro: Mental Status: Patient is awake,  alert, oriented to person, place, month, year, and situation. Patient is able to give a clear and coherent history. No signs of aphasia or neglect Object identification normal Cranial Nerves: II: Visual Fields are full on the left. Also full on right except subtle possible peripheral field impairment-patient unable to cooperate fully with tends to look to the right time to test peripheral vision.  Pupils are equal, round, and reactive to light.  III,IV, VI: EOMI without ptosis or diploplia.  V: Facial sensation is symmetric to temperature VII: Facial movement is symmetric.  VIII: hearing is intact to voice X: Uvula elevates symmetrically XI: Shoulder shrug is symmetric. XII: tongue is midline without atrophy or fasciculations.  Motor: Tone is normal. Bulk is normal. 5/5 strength was present in all four extremities. Negative pronator drift Sensory: Sensation is symmetric to light touch and temperature in the arms and legs. Plantars: Toes are downgoing bilaterally.  Cerebellar: FNF and HKS are intact bilaterally  ASSESSMENT/PLAN  Per Dr. Tollie EthKhaliqdina's H&P: 70yo male with PMH of DM, HTN, HLD presents with self resolved episode of left sided weakness x 2 and episode of "wonky vision" and R sided headache with resolution of his neuro symptoms and persistent right sided headache. Exam wnl on arrival to ED.   Stroke: Scattered embolic appearing posterior circulation strokes with multifocal multivessel intracranial stenosis, most notably distal right vertebral artery occlusion, diminutive basilar artery and bilateral posterior cerebral artery  atherosclerosis.  Code Stroke note done, MRI obtained immediately instead.   CTA head & neck: multivessel intracranial stenosis. Mild atherosclerotic changes of the carotid bifurcations without  evidence of stenosis (50% or greater) or occlusion  MRI  Brain: Multiple scattered small acute infarctions within both cerebellar hemispheres. Larger confluent infarction affecting the left occipital lobe measuring up to 3 cm in size. No evidence of hemorrhage or mass effect.  2D Echo is pending  LDL 81  HgbA1c 6.1  VTE prophylaxis - Lovenox 40mg  daily     Diet   Diet heart healthy/carb modified Room service appropriate? Yes; Fluid consistency: Thin     No antiplatelet or anticoagulant prior to admission, now on ASA 81 mg and Plavix 75mg  x 3 months then ASA alone   Therapy recommendations:  Cleared for home  Disposition:  Cleared for home   Hypertension . Hypertension (OK if < 220/120) but gradually normalize in 5-7 days . Long-term BP goal normotensive  Hyperlipidemia  Home meds:  40 mg pravachol (moderate intensity)  LDL 81, goal < 70  High intensity statin: Atorvastatin 80mg  on board  Continue high intensity statin at discharge  Other Stroke Risk Factors  Advanced Age >/= 6565   Former Cigarette smoker  Other Active Problems  Multinodular thyroid gland 4.3 cm TR4 thyroid nodule in the patient's isthmus. Nonemergent outpatient fine-needle aspiration is recommended for this thyroid nodule  Hospital day # 1 I have personally obtained history,examined this patient, reviewed notes, independently viewed imaging studies, participated in medical decision making and plan of care.ROS completed by me personally and pertinent positives fully documented  I have made any additions or clarifications directly to the above note. Agree with note above.  Patient presented with kaleidoscopic visual symptoms and occipital headache and MRI shows bilateral multiple cerebellar and left  occipital infarcts likely from diffuse intracranial atherosclerosis.  Recommend dual antiplatelet therapy of aspirin 81 and Plavix 75 mg daily for 3 months along with aggressive risk factor modification.  Long discussion patient and Dr. Lowell GuitarPowell and answered questions.  Greater than  50% time during this 35-minute visit was spent on counseling and coordination of care about stroke and answering questions.  Delia Heady, MD Medical Director [facility] Pager: [phone] 04/08/2020 3:01 PM   To contact Stroke Continuity provider, please refer to [email or web address]. After hours, contact General Neurology

## 2020-04-11 DIAGNOSIS — I10 Essential (primary) hypertension: Secondary | ICD-10-CM | POA: Diagnosis not present

## 2020-04-11 DIAGNOSIS — E785 Hyperlipidemia, unspecified: Secondary | ICD-10-CM | POA: Diagnosis not present

## 2020-04-11 DIAGNOSIS — E119 Type 2 diabetes mellitus without complications: Secondary | ICD-10-CM | POA: Diagnosis not present

## 2020-04-11 DIAGNOSIS — I63211 Cerebral infarction due to unspecified occlusion or stenosis of right vertebral arteries: Secondary | ICD-10-CM | POA: Diagnosis not present

## 2020-04-11 DIAGNOSIS — E041 Nontoxic single thyroid nodule: Secondary | ICD-10-CM | POA: Diagnosis not present

## 2020-05-04 DIAGNOSIS — E119 Type 2 diabetes mellitus without complications: Secondary | ICD-10-CM | POA: Diagnosis not present

## 2020-05-04 DIAGNOSIS — H53411 Scotoma involving central area, right eye: Secondary | ICD-10-CM | POA: Diagnosis not present

## 2020-05-04 DIAGNOSIS — H5310 Unspecified subjective visual disturbances: Secondary | ICD-10-CM | POA: Diagnosis not present

## 2020-05-10 ENCOUNTER — Encounter: Payer: Self-pay | Admitting: Adult Health

## 2020-05-10 ENCOUNTER — Ambulatory Visit (INDEPENDENT_AMBULATORY_CARE_PROVIDER_SITE_OTHER): Payer: Medicare Other | Admitting: Adult Health

## 2020-05-10 VITALS — BP 119/75 | HR 81 | Ht 70.0 in | Wt 193.0 lb

## 2020-05-10 DIAGNOSIS — R2689 Other abnormalities of gait and mobility: Secondary | ICD-10-CM

## 2020-05-10 DIAGNOSIS — I69398 Other sequelae of cerebral infarction: Secondary | ICD-10-CM

## 2020-05-10 DIAGNOSIS — I639 Cerebral infarction, unspecified: Secondary | ICD-10-CM

## 2020-05-10 MED ORDER — CLOPIDOGREL BISULFATE 75 MG PO TABS: 75.0000 mg | ORAL_TABLET | Freq: Every day | ORAL | 0 refills | Status: AC

## 2020-05-10 MED ORDER — ATORVASTATIN CALCIUM 80 MG PO TABS: 80.0000 mg | ORAL_TABLET | Freq: Every day | ORAL | 3 refills | Status: DC

## 2020-05-10 MED ORDER — ASPIRIN EC 81 MG PO TBEC: 81.0000 mg | DELAYED_RELEASE_TABLET | Freq: Every day | ORAL | 11 refills | Status: DC

## 2020-05-10 NOTE — Patient Instructions (Signed)
Continue aspirin 81 mg daily and clopidogrel 75 mg daily  and restart atorvastatin  for secondary stroke prevention  Restart Plavix for total of 2 months then stop and continue Asprin alone  Stop pravastatin and restart atorvastatin   Continue to follow up with PCP regarding cholesterol, blood pressure and diabetes management  Maintain strict control of hypertension with blood pressure goal below 130/90, diabetes with hemoglobin A1c goal below 7% and cholesterol with LDL cholesterol (bad cholesterol) goal below 70 mg/dL.     Followup in the future with me in 6 months or call earlier if needed     Thank you for coming to see [place] at [facility]. I hope we have been able to provide you high quality care today.  You may receive a patient satisfaction survey over the next few weeks. We would appreciate your feedback and comments so that we may continue to improve ourselves and the health of our patients.

## 2020-05-10 NOTE — Progress Notes (Signed)
[facility] [address] [place]. [place] [address] [phone]       HOSPITAL FOLLOW UP NOTE  Christopher Kirk Date of Birth:  February 03, 1950 Medical Record Number:  [id number]   Reason for Referral:  hospital stroke follow up    SUBJECTIVE:   CHIEF COMPLAINT:  Chief Complaint  Patient presents with  . Follow-up    RM 14 with deborah (Wife) Pt is having fatigue, balance disturbance and back of head headaches.     HPI:   [name] is a 70 yo male with PMH of DM, HTN, HLD who presented on 11/07/2020 after transient episode of left sided weakness x 2 and episode of "wonky vision" and R sided headache with resolution of his neuro symptoms but persistent right sided headache.   Personally reviewed hospitalization pertinent progress notes, lab work and imaging with summary provided.  Evaluated by Dr. Pearlean Brownie with stroke work-up revealing scattered embolic appearing posterior circulation strokes with multifocal multivessel intracranial stenosis, most notably distal right vertebral artery occlusion, diminutive basilar artery and bilateral posterior cerebral artery atherosclerosis.  Recommended DAPT for 3 months and aspirin alone.  HTN stable.  LDL 81 and switch pravastatin to atorvastatin 80 mg daily.  Other stroke risk factors include advanced age and former tobacco use but no prior stroke history.  Other active problems include multinodular thyroid gland noted on imaging and recommended follow-up outpatient.  Evaluated by therapies and discharged home in stable condition without therapy needs  Scattered embolic appearing posterior circulation strokes with multifocal multivessel intracranial stenosis, most notably distal right vertebral artery occlusion, diminutive basilar artery and bilateral posterior cerebral artery atherosclerosis.  Code Stroke note done, MRI obtained immediately instead.   CTA head & neck: multivessel intracranial stenosis. Mild atherosclerotic  changes of the carotid bifurcations without  evidence of stenosis (50% or greater) or occlusion  MRI  Brain: Multiple scattered small acute infarctions within both cerebellar hemispheres. Larger confluent infarction affecting the left occipital lobe measuring up to 3 cm in size. No evidence of hemorrhage or mass effect.  2D Echo EF 60 to 65% without evidence of embolism  LDL 81  HgbA1c 6.1  VTE prophylaxis - Lovenox 40mg  daily   No antiplatelet or anticoagulant prior to admission, now on ASA 81 mg and Plavix 75mg  x 3 months then ASA alone   Therapy recommendations:  Cleared for home  Disposition:  Cleared for home   Today, 05/10/2020, [name] is being seen for hospital follow-up accompanied by his wife, 05/12/2020  Reports residual imbalance, visual impairment and fatigue with some improvement and has been slowly returning back to prior activities.  He does tire quicker and less activity tolerance since his stroke.  Evaluated by his ophthalmologist and per patient, has small area of visual loss in just his right eye in the bottom outer edge but has been slowly improving.  Compliant on aspirin and Plavix without associated side effects - he does need refill on plavix He is currently on pravastatin as he was only provided 30 days of atorvastatin -he was tolerating atorvastatin without side effects Blood pressure today 119/75   No further concerns at this time     ROS:   14 system review of systems performed and negative with exception of those listed in HPI  PMH:  Past Medical History:  Diagnosis Date  . CVA (cerebral vascular accident) (HCC) 04/07/2020  . Diabetes mellitus without complication (HCC)   . Dyslipidemia   . GERD (gastroesophageal reflux  disease)   . Hypertension     PSH:  Past Surgical History:  Procedure Laterality Date  . BACK SURGERY      Social History:  Social History   Socioeconomic History  . Marital status: Married    Spouse name: Not on file   . Number of children: Not on file  . Years of education: Not on file  . Highest education level: Not on file  Occupational History  . Occupation: retired  Tobacco Use  . Smoking status: Former Smoker    Packs/day: 3.00    Years: 20.00    Pack years: 60.00    Types: Cigarettes    Quit date: 1987    Years since quitting: 35.3  . Smokeless tobacco: Never Used  Substance and Sexual Activity  . Alcohol use: Yes    Comment: occ  . Drug use: Never  . Sexual activity: Not on file  Other Topics Concern  . Not on file  Social History Narrative  . Not on file   Social Determinants of Health   Financial Resource Strain: Not on file  Food Insecurity: Not on file  Transportation Needs: Not on file  Physical Activity: Not on file  Stress: Not on file  Social Connections: Not on file  Intimate Partner Violence: Not on file    Family History:  Family History  Problem Relation Age of Onset  . Stroke Father   . Stroke Sister   . Stroke Brother        x 3 brothers    Medications:   Current Outpatient Medications on File Prior to Visit  Medication Sig Dispense Refill  . amLODipine (NORVASC) 10 MG tablet Take 1 tablet (10 mg total) by mouth daily.    . carvedilol (COREG) 6.25 MG tablet Take 1 tablet (6.25 mg total) by mouth 2 (two) times daily.    . irbesartan (AVAPRO) 300 MG tablet Take 1 tablet (300 mg total) by mouth daily.    . metFORMIN (GLUCOPHAGE) 1000 MG tablet Take 1,000 mg by mouth 2 (two) times daily.     No current facility-administered medications on file prior to visit.    Allergies:  No Known Allergies    OBJECTIVE:  Physical Exam  Vitals:   05/10/20 1359  BP: 119/75  Pulse: 81  Weight: 193 lb (87.5 kg)  Height: 5\' 10"  (1.778 m)   Body mass index is 27.69 kg/m. No exam data present  Depression screen [facility] 2/9 05/10/2020  Decreased Interest 0  Down, Depressed, Hopeless 0  PHQ - 2 Score 0     General: well developed, well nourished, pleasant  elderly Caucasian male, seated, in no evident distress Head: head normocephalic and atraumatic.   Neck: supple with no carotid or supraclavicular bruits Cardiovascular: regular rate and rhythm, no murmurs Musculoskeletal: no deformity Skin:  no rash/petichiae Vascular:  Normal pulses all extremities   Neurologic Exam Mental Status: Awake and fully alert.   Fluent speech and language.  Oriented to place and time. Recent and remote memory intact. Attention span, concentration and fund of knowledge appropriate. Mood and affect appropriate.  Cranial Nerves: Fundoscopic exam reveals sharp disc margins. Pupils equal, briskly reactive to light. Extraocular movements full without nystagmus. Visual fields full to confrontation although subjective OD lower peripheral small area of visual loss. Hearing intact. Facial sensation intact. Face, tongue, palate moves normally and symmetrically.  Motor: Normal bulk and tone. Normal strength in all tested extremity muscles Sensory.: intact to touch , pinprick ,  position and vibratory sensation.  Coordination: Rapid alternating movements normal in all extremities. Finger-to-nose and heel-to-shin performed accurately bilaterally. Gait and Station: Arises from chair without difficulty. Stance is normal. Gait demonstrates normal stride length and balance without use of assistive device. Tandem walk and heel toe with mild to moderate difficulty.  Romberg mildly positive Reflexes: 1+ and symmetric. Toes downgoing.     NIHSS  0 Modified Rankin  2      ASSESSMENT: MACKEY VARRICCHIO is a 70 y.o. year old male presented after transient episode of left-sided weakness, kaleidoscope vision and right-sided headache on 04/07/2020 with stroke work-up revealing bilateral multiple cerebellar and left occipital infarcts likely from diffuse intracranial arthrosclerosis. Vascular risk factors include diffuse intracranial arthrosclerosis, HTN, HLD, DM, advanced age and former tobacco  use.      PLAN:  1. B/l cerebellar and L occipital strokes:  a. Residual deficit: gait impairment, visual loss and fatigue.  He declines interest in therapy at this time but will call office if interested.  Discussed typical recovery time post stroke but if fatigue does not improve, may need further evaluation with PCP or consider pursuing sleep study b. Continue aspirin 81 mg daily and clopidogrel 75 mg daily for additional 2 months then aspirin alone.  c. Recommend restarting high intensity statin with atorvastatin 80 mg daily and stopping pravastatin in setting of multivessel intracranial atherosclerosis.  Refill provided but request ongoing refills provided by PCP d.  Discussed secondary stroke prevention measures and importance of close PCP follow up for aggressive stroke risk factor management  2. HTN: BP goal <130/90.  Stable on current regimen per PCP 3. HLD: LDL goal <70. Recent LDL 81 on pravastatin - switched to atorvastatin which was recommended to restart.  Request routine follow-up with PCP for repeat lipid panel and ongoing prescribing of statin 4. DMII: A1c goal<7.0. Recent A1c 6.1.  On Metformin per PCP 5. Multivessel intracranial arthrosclerosis: Complete 3 months DAPT and aspirin alone and ongoing use of high intensity statin.  Discussed importance of managing stroke risk factors, healthy diet and adequate exercise    Follow up in 6 months or call earlier if needed   CC:  [facility] provider: Dr. Pearlean Brownie PCP: Chilton Greathouse, MD    I spent 45 minutes of face-to-face and non-face-to-face time with patient and wife.  This included previsit chart review including recent hospitalization pertinent progress notes, lab work and imaging, lab review, study review, order entry, electronic health record documentation, patient education regarding recent stroke and etiology, residual deficits, importance of managing stroke risk factors and answered all other questions to patient and wife's  satisfaction  Ihor Austin, AGNP-BC  [facility] [address] [place], [place] [address]  Phone [phone] Fax [phone] Note: This document was prepared with digital dictation and possible smart phrase technology. Any transcriptional errors that result from this process are unintentional.

## 2020-05-22 NOTE — Progress Notes (Signed)
I agree with the above plan 

## 2020-06-13 DIAGNOSIS — E119 Type 2 diabetes mellitus without complications: Secondary | ICD-10-CM | POA: Diagnosis not present

## 2020-06-13 DIAGNOSIS — E785 Hyperlipidemia, unspecified: Secondary | ICD-10-CM | POA: Diagnosis not present

## 2020-06-13 DIAGNOSIS — R634 Abnormal weight loss: Secondary | ICD-10-CM | POA: Diagnosis not present

## 2020-06-13 DIAGNOSIS — M5380 Other specified dorsopathies, site unspecified: Secondary | ICD-10-CM | POA: Diagnosis not present

## 2020-06-13 DIAGNOSIS — K279 Peptic ulcer, site unspecified, unspecified as acute or chronic, without hemorrhage or perforation: Secondary | ICD-10-CM | POA: Diagnosis not present

## 2020-06-13 DIAGNOSIS — I63211 Cerebral infarction due to unspecified occlusion or stenosis of right vertebral arteries: Secondary | ICD-10-CM | POA: Diagnosis not present

## 2020-06-13 DIAGNOSIS — E041 Nontoxic single thyroid nodule: Secondary | ICD-10-CM | POA: Diagnosis not present

## 2020-06-13 DIAGNOSIS — I10 Essential (primary) hypertension: Secondary | ICD-10-CM | POA: Diagnosis not present

## 2020-06-22 DIAGNOSIS — E041 Nontoxic single thyroid nodule: Secondary | ICD-10-CM | POA: Diagnosis not present

## 2020-06-22 DIAGNOSIS — E785 Hyperlipidemia, unspecified: Secondary | ICD-10-CM | POA: Diagnosis not present

## 2020-06-27 DIAGNOSIS — W07XXXA Fall from chair, initial encounter: Secondary | ICD-10-CM | POA: Diagnosis not present

## 2020-06-27 DIAGNOSIS — M533 Sacrococcygeal disorders, not elsewhere classified: Secondary | ICD-10-CM | POA: Diagnosis not present

## 2020-08-10 DIAGNOSIS — H53411 Scotoma involving central area, right eye: Secondary | ICD-10-CM | POA: Diagnosis not present

## 2020-08-10 DIAGNOSIS — H5310 Unspecified subjective visual disturbances: Secondary | ICD-10-CM | POA: Diagnosis not present

## 2020-09-28 ENCOUNTER — Ambulatory Visit (HOSPITAL_COMMUNITY)
Admission: RE | Admit: 2020-09-28 | Discharge: 2020-09-28 | Disposition: A | Discharge status: Home / Self Care | Payer: Medicare Other | Source: Ambulatory Visit | Attending: Internal Medicine | Admitting: Internal Medicine

## 2020-09-28 ENCOUNTER — Other Ambulatory Visit: Payer: Self-pay | Admitting: Internal Medicine

## 2020-09-28 ENCOUNTER — Other Ambulatory Visit (HOSPITAL_COMMUNITY): Payer: Self-pay | Admitting: Internal Medicine

## 2020-09-28 ENCOUNTER — Other Ambulatory Visit: Payer: Self-pay

## 2020-09-28 DIAGNOSIS — R42 Dizziness and giddiness: Secondary | ICD-10-CM

## 2020-09-28 DIAGNOSIS — E785 Hyperlipidemia, unspecified: Secondary | ICD-10-CM | POA: Diagnosis not present

## 2020-09-28 DIAGNOSIS — R2681 Unsteadiness on feet: Secondary | ICD-10-CM

## 2020-09-28 DIAGNOSIS — E119 Type 2 diabetes mellitus without complications: Secondary | ICD-10-CM | POA: Diagnosis not present

## 2020-09-28 DIAGNOSIS — M5380 Other specified dorsopathies, site unspecified: Secondary | ICD-10-CM | POA: Diagnosis not present

## 2020-09-28 DIAGNOSIS — R634 Abnormal weight loss: Secondary | ICD-10-CM | POA: Diagnosis not present

## 2020-09-28 DIAGNOSIS — I63211 Cerebral infarction due to unspecified occlusion or stenosis of right vertebral arteries: Secondary | ICD-10-CM | POA: Diagnosis not present

## 2020-09-28 DIAGNOSIS — R519 Headache, unspecified: Secondary | ICD-10-CM | POA: Diagnosis not present

## 2020-09-28 DIAGNOSIS — E041 Nontoxic single thyroid nodule: Secondary | ICD-10-CM | POA: Diagnosis not present

## 2020-09-28 DIAGNOSIS — K279 Peptic ulcer, site unspecified, unspecified as acute or chronic, without hemorrhage or perforation: Secondary | ICD-10-CM | POA: Diagnosis not present

## 2020-09-28 DIAGNOSIS — I10 Essential (primary) hypertension: Secondary | ICD-10-CM | POA: Diagnosis not present

## 2020-09-28 DIAGNOSIS — I6621 Occlusion and stenosis of right posterior cerebral artery: Secondary | ICD-10-CM | POA: Diagnosis not present

## 2020-09-28 MED ORDER — GADOBUTROL 1 MMOL/ML IV SOLN
9.0000 mL | Freq: Once | INTRAVENOUS | Status: AC | PRN
  Administered 2020-09-28: 9 mL via INTRAVENOUS

## 2020-10-03 ENCOUNTER — Other Ambulatory Visit: Payer: Self-pay

## 2020-10-03 ENCOUNTER — Encounter: Payer: Self-pay | Admitting: Neurology

## 2020-10-03 ENCOUNTER — Ambulatory Visit (INDEPENDENT_AMBULATORY_CARE_PROVIDER_SITE_OTHER): Payer: Medicare Other | Admitting: Neurology

## 2020-10-03 VITALS — BP 125/84 | HR 85 | Wt 194.0 lb

## 2020-10-03 DIAGNOSIS — G44209 Tension-type headache, unspecified, not intractable: Secondary | ICD-10-CM | POA: Diagnosis not present

## 2020-10-03 DIAGNOSIS — G4452 New daily persistent headache (NDPH): Secondary | ICD-10-CM | POA: Diagnosis not present

## 2020-10-03 DIAGNOSIS — I639 Cerebral infarction, unspecified: Secondary | ICD-10-CM

## 2020-10-03 MED ORDER — TOPIRAMATE 50 MG PO TABS: 50.0000 mg | ORAL_TABLET | Freq: Two times a day (BID) | ORAL | 2 refills | Status: DC

## 2020-10-03 NOTE — Progress Notes (Signed)
[facility] [address] [place]. [place] [address] [phone]       OFFICE CONSULT NOTE  [name] Date of Birth:  [birth year] Medical Record Number:  [id number]   Referring MD: Larina Earthly  Reason for Referral: Headache and dizziness  HPI: Christopher Kirk is a 70 year old Caucasian male seen today for office consultation visit for headaches and dizziness.  Is accompanied by his wife.  History is obtained from them and review of electronic medical records and personally reviewed pertinent available imaging films in PACS.Christopher Kirk is a 70 y.o. 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  Patient has had residual right-sided peripheral visual difficulties as well as gait and balance issues.  He is referred back to see me today for mostly because of concerns about headache.  Patient states that for the last 2 -3 weeks he has had a daily headache.  Headache starts mostly in the back and occasionally goes to the front.  It varies in severity from 3/10-8/10.  At times it is dull in times it is quite throbbing and severe.  There is some light sensitivity and sound sensitivity.  He does have prior history of what sound like migraine headaches most of his life but they have been infrequent.  Usually finds relief with Aleve or migraine Excedrin.  He has been taking 1 or 2 of these tablets daily for the last 2 weeks with headaches keep on coming back.  The headaches are tolerable if he takes his pills and is able to work but they are not going away.  He had an MRI scan of the brain done on 09/28/2020 which shows no acute abnormalities.  Old left  occipital and basal ganglia infarcts are noted.  MR angiogram of the brain shows diminutive basilar artery with possible mid basilar occlusion and left posterior cerebral artery also is occluded and right posterior cerebral artery has moderate stenosis at P1 P2 junction.  He had lab work done on 06/22/2020 which showed optimal LDL cholesterol at 59 mg percent and hemoglobin A1c at 5.2.  He was on aspirin and Plavix for 3 months following his stroke in March and subsequently on aspirin alone but was recently put back on Plavix by his primary physician few weeks ago.  Patient states he is recovered well from his stroke has some mild residual balance difficulties which seem to be more pronounced when he is fatigued.  He has some slight peripheral vision difficulty in the right eye as well.  His blood pressure is well controlled and today is 125/84.  He is tolerating his Lipitor well without any side effects.  ROS:   14 system review of systems is positive for dizziness, gait imbalance, vision dysfunction, headache, neck pain all other systems negative  PMH:  Past Medical History:  Diagnosis Date   CVA (cerebral vascular accident) (HCC) 04/07/2020   Diabetes mellitus without complication (HCC)    Dyslipidemia    GERD (gastroesophageal reflux disease)    Hypertension     Social History:  Social History   Socioeconomic History   Marital status: Married    Spouse name: Not on file   Number of children: Not on file   Years of education: Not  on file   Highest education level: Not on file  Occupational History   Occupation: retired  Tobacco Use   Smoking status: Former    Packs/day: 3.00    Years: 20.00    Pack years: 60.00    Types: Cigarettes    Quit date: 1987    Years since quitting: 35.7   Smokeless tobacco: Never  Substance and Sexual Activity   Alcohol use: Yes    Comment: occ   Drug use: Never   Sexual activity: Not on file  Other Topics Concern   Not on file  Social History  Narrative   Not on file   Social Determinants of Health   Financial Resource Strain: Not on file  Food Insecurity: Not on file  Transportation Needs: Not on file  Physical Activity: Not on file  Stress: Not on file  Social Connections: Not on file  Intimate Partner Violence: Not on file    Medications:   Current Outpatient Medications on File Prior to Visit  Medication Sig Dispense Refill   amLODipine (NORVASC) 10 MG tablet Take 1 tablet (10 mg total) by mouth daily.     atorvastatin (LIPITOR) 80 MG tablet Take 1 tablet (80 mg total) by mouth daily. 90 tablet 3   carvedilol (COREG) 6.25 MG tablet Take 1 tablet (6.25 mg total) by mouth 2 (two) times daily.     clopidogrel (PLAVIX) 75 MG tablet Take 1 tablet (75 mg total) by mouth daily. 60 tablet 0   irbesartan (AVAPRO) 300 MG tablet Take 1 tablet (300 mg total) by mouth daily.     metFORMIN (GLUCOPHAGE) 1000 MG tablet Take 1,000 mg by mouth 2 (two) times daily.     No current facility-administered medications on file prior to visit.    Allergies:  No Known Allergies  Physical Exam General: well developed, well nourished middle-aged Caucasian male, seated, in no evident distress Head: head normocephalic and atraumatic.   Neck: supple with no carotid or supraclavicular bruits Cardiovascular: regular rate and rhythm, no murmurs Musculoskeletal: no deformity Skin:  no rash/petichiae Vascular:  Normal pulses all extremities  Neurologic Exam Mental Status: Awake and fully alert. Oriented to place and time. Recent and remote memory intact. Attention span, concentration and fund of knowledge appropriate. Mood and affect appropriate.  Cranial Nerves: Fundoscopic exam reveals sharp disc margins. Pupils equal, briskly reactive to light. Extraocular movements full without nystagmus. Visual fields full to confrontation except partial right inferior quadrantic loss.  [place] it is diminished in the right eye. Hearing intact. Facial sensation  intact. Face, tongue, palate moves normally and symmetrically.  Motor: Normal bulk and tone. Normal strength in all tested extremity muscles. Sensory.: intact to touch , pinprick , position and vibratory sensation.  Coordination: Rapid alternating movements normal in all extremities. Finger-to-nose and heel-to-shin performed accurately bilaterally. Gait and Station: Arises from chair without difficulty. Stance is normal. Gait demonstrates normal stride length and balance .  Not able to heel, toe and tandem walk  .  Reflexes: 1+ and symmetric. Toes downgoing.   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  He has long lasting history of migraine headaches.  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  I recommend he discontinue taking daily Aleve and Excedrin and instead start taking Topamax 50 mg twice daily.  He may also use Tylenol Extra Strength 2 tablets as needed but not more than 3 days/week.  I recommend he do regular neck stretching exercises.  Continue Plavix for stroke prevention and discontinue aspirin and maintain aggressive risk factor modification with strict control of hypertension with blood pressure goal below 130/90, lipids with LDL cholesterol goal below 70 mg percent and Diabetes with hemoglobin A1c goal below 6.5%.  He also encouraged to drink plenty of fluids and keep himself well-hydrated.  He will return for follow-up in the  future in 3 months with my nurse practitioner Shanda Bumps or call earlier if necessary.  Greater than 50% time during this 45-minute consultation visit was spent on counseling and coordination of care about his daily persistent headache as well as discussion about his prior stroke and discussion about stroke prevention. Delia Heady, MD Note: This document was prepared with digital dictation and possible smart phrase technology. Any transcriptional errors that result from this process are unintentional.

## 2020-10-03 NOTE — Patient Instructions (Addendum)
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  I recommend he discontinue taking daily Aleve and Excedrin and instead start taking Topamax 50 mg twice daily.  He may also use Tylenol Extra Strength 2 tablets as needed but not more than 3 days/week.  I recommend he do regular neck stretching exercises.  Continue Plavix for stroke prevention and discontinue aspirin and maintain aggressive risk factor modification with strict control of hypertension with blood pressure goal below 130/90, lipids with LDL cholesterol goal below 70 mg percent and Diabetes with hemoglobin A1c goal below 6.5%.  He also encouraged to drink plenty of fluids and keep himself well-hydrated.  He will return for follow-up in the future in 3 months with my nurse practitioner Shanda Bumps or call earlier if necessary. Neck Exercises Ask your health care provider which exercises are safe for you. Do exercises exactly as told by your health care provider and adjust them as directed. It is normal to feel mild stretching, pulling, tightness, or discomfort as you do these exercises. Stop right away if you feel sudden pain or your pain gets worse. Do not begin these exercises until told by your health care provider. Neck exercises can be important for many reasons. They can improve strength and maintain flexibility in your neck, which will help your upper back and prevent neck pain. Stretching exercises Rotation neck stretching  Sit in a chair or stand up. Place your feet flat on the floor, shoulder width apart. Slowly turn your head (rotate) to the right until a slight stretch is felt. Turn it all the way to the right so you can look over your right shoulder. Do not tilt or tip your head. Hold this position for 10-30 seconds. Slowly turn your head (rotate) to the left until a slight  stretch is felt. Turn it all the way to the left so you can look over your left shoulder. Do not tilt or tip your head. Hold this position for 10-30 seconds. Repeat __________ times. Complete this exercise __________ times a day. Neck retraction Sit in a sturdy chair or stand up. Look straight ahead. Do not bend your neck. Use your fingers to push your chin backward (retraction). Do not bend your neck for this movement. Continue to face straight ahead. If you are doing the exercise properly, you will feel a slight sensation in your throat and a stretch at the back of your neck. Hold the stretch for 1-2 seconds. Repeat __________ times. Complete this exercise __________ times a day. Strengthening exercises Neck press Lie on your back on a firm bed or on the floor with a pillow under your head. Use your neck muscles to push your head down on the pillow and straighten your spine. Hold the position as well as you can. Keep your head facing up (in a neutral position) and your chin tucked. Slowly count to 5 while holding this position. Repeat __________ times. Complete this exercise __________ times a day. Isometrics These are exercises in which you strengthen the muscles in your neck while keeping your neck still (isometrics). Sit in a supportive chair and place your hand on your forehead. Keep your head and face facing straight ahead. Do not flex or extend your neck while doing isometrics. Push forward with your head and neck while pushing back with your hand. Hold for 10 seconds. Do the  sequence again, this time putting your hand against the back of your head. Use your head and neck to push backward against the hand pressure. Finally, do the same exercise on either side of your head, pushing sideways against the pressure of your hand. Repeat __________ times. Complete this exercise __________ times a day. Prone head lifts Lie face-down (prone position), resting on your elbows so that your chest  and upper back are raised. Start with your head facing downward, near your chest. Position your chin either on or near your chest. Slowly lift your head upward. Lift until you are looking straight ahead. Then continue lifting your head as far back as you can comfortably stretch. Hold your head up for 5 seconds. Then slowly lower it to your starting position. Repeat __________ times. Complete this exercise __________ times a day. Supine head lifts Lie on your back (supine position), bending your knees to point to the ceiling and keeping your feet flat on the floor. Lift your head slowly off the floor, raising your chin toward your chest. Hold for 5 seconds. Repeat __________ times. Complete this exercise __________ times a day. Scapular retraction Stand with your arms at your sides. Look straight ahead. Slowly pull both shoulders (scapulae) backward and downward (retraction) until you feel a stretch between your shoulder blades in your upper back. Hold for 10-30 seconds. Relax and repeat. Repeat __________ times. Complete this exercise __________ times a day. Contact a health care provider if: Your neck pain or discomfort gets much worse when you do an exercise. Your neck pain or discomfort does not improve within 2 hours after you exercise. If you have any of these problems, stop exercising right away. Do not do the exercises again unless your health care provider says that you can. Get help right away if: You develop sudden, severe neck pain. If this happens, stop exercising right away. Do not do the exercises again unless your health care provider says that you can. This information is not intended to replace advice given to you by your health care provider. Make sure you discuss any questions you have with your health care provider. Document Revised: 11/12/2017 Document Reviewed: 11/12/2017 Elsevier Patient Education  2022 [employer].

## 2020-10-06 DIAGNOSIS — Z23 Encounter for immunization: Secondary | ICD-10-CM | POA: Diagnosis not present

## 2020-11-13 ENCOUNTER — Ambulatory Visit: Payer: Medicare Other | Admitting: Adult Health

## 2021-01-01 ENCOUNTER — Ambulatory Visit (INDEPENDENT_AMBULATORY_CARE_PROVIDER_SITE_OTHER): Payer: Medicare Other | Admitting: Adult Health

## 2021-01-01 ENCOUNTER — Encounter: Payer: Self-pay | Admitting: Adult Health

## 2021-01-01 VITALS — BP 114/71 | HR 67 | Ht 70.0 in | Wt 184.0 lb

## 2021-01-01 DIAGNOSIS — G4489 Other headache syndrome: Secondary | ICD-10-CM

## 2021-01-01 DIAGNOSIS — I639 Cerebral infarction, unspecified: Secondary | ICD-10-CM | POA: Diagnosis not present

## 2021-01-01 DIAGNOSIS — Z9189 Other specified personal risk factors, not elsewhere classified: Secondary | ICD-10-CM

## 2021-01-01 MED ORDER — TOPIRAMATE 50 MG PO TABS: 50.0000 mg | ORAL_TABLET | Freq: Two times a day (BID) | ORAL | 3 refills | Status: DC

## 2021-01-01 NOTE — Patient Instructions (Addendum)
Continue clopidogrel 75 mg daily  and atorvastatin 80mg  daily  for secondary stroke prevention  Continue to follow up with PCP regarding cholesterol, blood pressure and diabetes management  Maintain strict control of hypertension with blood pressure goal below 130/90, diabetes with hemoglobin A1c goal below 7.0% and cholesterol with LDL cholesterol (bad cholesterol) goal below 70 mg/dL.   Would highly recommend we further test you for possible sleep apnea - untreated sleep apnea can increase your risk of additional strokes and heart disease - please let me know if you would like to proceed with further evaluation in the future  Continue topamax 50mg  twice daily for headache prevention     Followup in the future with me in 6 months or call earlier if needed       Thank you for coming to see at [facility]. I hope we have been able to provide you high quality care today.  You may receive a patient satisfaction survey over the next few weeks. We would appreciate your feedback and comments so that we may continue to improve ourselves and the health of our patients.   Sleep Apnea Sleep apnea is a condition in which breathing pauses or becomes shallow during sleep. People with sleep apnea usually snore loudly. They may have times when they gasp and stop breathing for 10 seconds or more during sleep. This may happen many times during the night. Sleep apnea disrupts your sleep and keeps your body from getting the rest that it needs. This condition can increase your risk of certain health problems, including: Heart attack. Stroke. Obesity. Type 2 diabetes. Heart failure. Irregular heartbeat. High blood pressure. The goal of treatment is to help you breathe normally again. What are the causes? The most common cause of sleep apnea is a collapsed or blocked airway. There are three kinds of sleep apnea: Obstructive sleep apnea. This kind is caused by a blocked or  collapsed airway. Central sleep apnea. This kind happens when the part of the brain that controls breathing does not send the correct signals to the muscles that control breathing. Mixed sleep apnea. This is a combination of obstructive and central sleep apnea. What increases the risk? You are more likely to develop this condition if you: Are overweight. Smoke. Have a smaller than normal airway. Are older. Are male. Drink alcohol. Take sedatives or tranquilizers. Have a family history of sleep apnea. Have a tongue or tonsils that are larger than normal. What are the signs or symptoms? Symptoms of this condition include: Trouble staying asleep. Loud snoring. Morning headaches. Waking up gasping. Dry mouth or sore throat in the morning. Daytime sleepiness and tiredness. If you have daytime fatigue because of sleep apnea, you may be more likely to have: Trouble concentrating. Forgetfulness. Irritability or mood swings. Personality changes. Feelings of depression. Sexual dysfunction. This may include loss of interest if you are male, or erectile dysfunction if you are male. How is this diagnosed? This condition may be diagnosed with: A medical history. A physical exam. A series of tests that are done while you are sleeping (sleep study). These tests are usually done in a sleep lab, but they may also be done at home. How is this treated? Treatment for this condition aims to restore normal breathing and to ease symptoms during sleep. It may involve managing health issues that can affect breathing, such as high blood pressure or obesity. Treatment may include: Sleeping on your side. Using a decongestant if you have nasal  congestion. Avoiding the use of depressants, including alcohol, sedatives, and narcotics. Losing weight if you are overweight. Making changes to your diet. Quitting smoking. Using a device to open your airway while you sleep, such as: An oral appliance. This is  a custom-made mouthpiece that shifts your lower jaw forward. A continuous positive airway pressure (CPAP) device. This device blows air through a mask when you breathe out (exhale). A nasal expiratory positive airway pressure (EPAP) device. This device has valves that you put into each nostril. A bi-level positive airway pressure (BIPAP) device. This device blows air through a mask when you breathe in (inhale) and breathe out (exhale). Having surgery if other treatments do not work. During surgery, excess tissue is removed to create a wider airway. Follow these instructions at home: Lifestyle Make any lifestyle changes that your health care provider recommends. Eat a healthy, well-balanced diet. Take steps to lose weight if you are overweight. Avoid using depressants, including alcohol, sedatives, and narcotics. Do not use any products that contain nicotine or tobacco. These products include cigarettes, chewing tobacco, and vaping devices, such as e-cigarettes. If you need help quitting, ask your health care provider. General instructions Take over-the-counter and prescription medicines only as told by your health care provider. If you were given a device to open your airway while you sleep, use it only as told by your health care provider. If you are having surgery, make sure to tell your health care provider you have sleep apnea. You may need to bring your device with you. Keep all follow-up visits. This is important. Contact a health care provider if: The device that you received to open your airway during sleep is uncomfortable or does not seem to be working. Your symptoms do not improve. Your symptoms get worse. Get help right away if: You develop: Chest pain. Shortness of breath. Discomfort in your back, arms, or stomach. You have: Trouble speaking. Weakness on one side of your body. Drooping in your face. These symptoms may represent a serious problem that is an emergency. Do not  wait to see if the symptoms will go away. Get medical help right away. Call your local emergency services (911 in the [place] Do not drive yourself to the hospital. Summary Sleep apnea is a condition in which breathing pauses or becomes shallow during sleep. The most common cause is a collapsed or blocked airway. The goal of treatment is to restore normal breathing and to ease symptoms during sleep. This information is not intended to replace advice given to you by your health care provider. Make sure you discuss any questions you have with your health care provider. Document Revised: 08/23/2020 Document Reviewed: 12/24/2019 Elsevier Patient Education  2022 [employer].

## 2021-01-01 NOTE — Progress Notes (Signed)
[facility] [address] [place]. [place] [address] [phone]       STROKE FOLLOW UP NOTE  [name] Date of Birth:  28-May-1950 Medical Record Number:  [id number]   Reason for Referral: stroke follow up    SUBJECTIVE:   CHIEF COMPLAINT:  Chief Complaint  Patient presents with   Follow-up    RM 2 with spouse Christopher Kirk  Pt is well, headaches are manageable, currently has about 2-3 a month.  No new stroke concerns.      HPI:   Update 01/01/2021 JM: Returns for follow-up after prior visit with Dr. Pearlean Brownie for new daily persistent headaches 3 months ago and prior stroke history.  He is accompanied today by his wife.  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 Reports great improvement of headaches currently experiencing 1 to 2/month only lasting a few hours after taking Tylenol.  He denies any continued analgesic overuse.  He has tolerated topiramate without side effects.  Stable from stroke standpoint without new stroke/TIA symptoms Continued imbalance but per patient, currently at baseline with chronic gait impairment/imbalance due to chronic lumbar issues. Continued OD lower outer edge visual impairment which has been stable.  Routinely followed by ophthalmology.  Does not interfere with daily functioning or activity. Reports continued day time fatigue (not worsened since starting Topamax).  Initially reported he sleeps well at night but then reported he will have to wake up at times in the middle the night to let the dogs out or go to the bathroom. He does not feel rested upon awakening in the morning.  Occasionally awakens with headache.  Has not previously underwent sleep study.  Compliant on Plavix and atorvastatin -denies side effects Blood pressure today 114/71 - occasionally monitors at home which has been stable  No new concerns at  this time    History provided for reference purposes only Update 10/03/2020 Dr. Pearlean Brownie: Christopher Kirk is a 70 year old Caucasian male seen today for office consultation visit for headaches and dizziness.  Is accompanied by his wife.  History is obtained from them and review of electronic medical records and personally reviewed pertinent available imaging films in PACS.Christopher Kirk is a 70 y.o. 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  Patient has had residual right-sided peripheral visual difficulties as well as gait and balance issues.  He is referred back to see me today for mostly because of concerns about headache.  Patient states that for the last 2 -3 weeks he has had a daily headache.  Headache starts mostly in the back and occasionally goes to the front.  It varies in severity from 3/10-8/10.  At times it is dull in times it is quite throbbing and severe.  There is some light sensitivity and sound sensitivity.  He does have prior history of what sound like migraine headaches most of his life but they have been infrequent.  Usually finds relief with Aleve or migraine Excedrin.  He has been taking 1 or 2 of these tablets daily for the last 2 weeks with headaches keep on coming back.  The headaches are tolerable if he takes his  pills and is able to work but they are not going away.  He had an MRI scan of the brain done on 09/28/2020 which shows no acute abnormalities.  Old left occipital and basal ganglia infarcts are noted.  MR angiogram of the brain shows diminutive basilar artery with possible mid basilar occlusion and left posterior cerebral artery also is occluded  and right posterior cerebral artery has moderate stenosis at P1 P2 junction.  He had lab work done on 06/22/2020 which showed optimal LDL cholesterol at 59 mg percent and hemoglobin A1c at 5.2.  He was on aspirin and Plavix for 3 months following his stroke in March and subsequently on aspirin alone but was recently put back on Plavix by his primary physician few weeks ago.  Patient states he is recovered well from his stroke has some mild residual balance difficulties which seem to be more pronounced when he is fatigued.  He has some slight peripheral vision difficulty in the right eye as well.  His blood pressure is well controlled and today is 125/84.  He is tolerating his Lipitor well without any side effects.    Initial visit 05/10/2020 JM: Christopher Kirk is being seen for hospital follow-up accompanied by his wife, Christopher Kirk  Reports residual imbalance, visual impairment and fatigue with some improvement and has been slowly returning back to prior activities.  He does tire quicker and less activity tolerance since his stroke.  Evaluated by his ophthalmologist and per patient, has small area of visual loss in just his right eye in the bottom outer edge but has been slowly improving.  Compliant on aspirin and Plavix without associated side effects - he does need refill on plavix He is currently on pravastatin as he was only provided 30 days of atorvastatin -he was tolerating atorvastatin without side effects Blood pressure today 119/75   No further concerns at this time  Stroke admission 11/07/2020 Christopher Kirk is a 70 yo male with PMH of DM, HTN, HLD who presented on 11/07/2020 after transient episode of left sided weakness x 2 and episode of "wonky vision" and R sided headache with resolution of his neuro symptoms but persistent right sided headache.   Personally reviewed hospitalization pertinent progress notes, lab work and imaging with summary provided.  Evaluated by Dr. Pearlean Brownie with stroke work-up  revealing scattered embolic appearing posterior circulation strokes with multifocal multivessel intracranial stenosis, most notably distal right vertebral artery occlusion, diminutive basilar artery and bilateral posterior cerebral artery atherosclerosis.  Recommended DAPT for 3 months and aspirin alone.  HTN stable.  LDL 81 and switch pravastatin to atorvastatin 80 mg daily.  Other stroke risk factors include advanced age and former tobacco use but no prior stroke history.  Other active problems include multinodular thyroid gland noted on imaging and recommended follow-up outpatient.  Evaluated by therapies and discharged home in stable condition without therapy needs  Scattered embolic appearing posterior circulation strokes with multifocal multivessel intracranial stenosis, most notably distal right vertebral artery occlusion, diminutive basilar artery and bilateral posterior cerebral artery atherosclerosis. Code Stroke note done, MRI obtained immediately instead.  CTA head & neck: multivessel intracranial stenosis. Mild atherosclerotic changes of the carotid bifurcations without  evidence of stenosis (50% or greater) or occlusion MRI  Brain: Multiple scattered small acute infarctions within both cerebellar hemispheres. Larger confluent infarction affecting the left occipital lobe measuring up to 3 cm in size. No evidence of hemorrhage or mass effect. 2D Echo EF 60 to 65% without evidence of embolism  LDL 81 HgbA1c 6.1 VTE prophylaxis - Lovenox  daily  No antiplatelet or anticoagulant prior to admission, now on ASA 81 mg and Plavix  x 3 months then ASA alone  Therapy recommendations:  Cleared for home Disposition:  Cleared for home       ROS:   14 system review of systems performed and negative with exception of those listed in HPI  PMH:  Past Medical History:  Diagnosis Date   CVA (cerebral vascular accident) (HCC) 04/07/2020   Diabetes mellitus without complication (HCC)     Dyslipidemia    GERD (gastroesophageal reflux disease)    Hypertension     PSH:  Past Surgical History:  Procedure Laterality Date   BACK SURGERY      Social History:  Social History   Socioeconomic History   Marital status: Married    Spouse name: Not on file   Number of children: Not on file   Years of education: Not on file   Highest education level: Not on file  Occupational History   Occupation: retired  Tobacco Use   Smoking status: Former    Packs/day: 3.00    Years: 20.00    Pack years: 60.00    Types: Cigarettes    Quit date: 1987    Years since quitting: 35.9   Smokeless tobacco: Never  Substance and Sexual Activity   Alcohol use: Yes    Comment: occ   Drug use: Never   Sexual activity: Not on file  Other Topics Concern   Not on file  Social History Narrative   Not on file   Social Determinants of Health   Financial Resource Strain: Not on file  Food Insecurity: Not on file  Transportation Needs: Not on file  Physical Activity: Not on file  Stress: Not on file  Social Connections: Not on file  Intimate Partner Violence: Not on file    Family History:  Family History  Problem Relation Age of Onset   Stroke Father    Stroke Sister    Stroke Brother        x 3 brothers    Medications:   Current Outpatient Medications on File Prior to Visit  Medication Sig Dispense Refill   amLODipine (NORVASC) 10 MG tablet Take 1 tablet (10 mg total) by mouth daily.     atorvastatin (LIPITOR) 80 MG tablet Take 1 tablet (80 mg total) by mouth daily. 90 tablet 3   carvedilol (COREG) 6.25 MG tablet Take 1 tablet (6.25 mg total) by mouth 2 (two) times daily.     clopidogrel (PLAVIX) 75 MG tablet Take 1 tablet (75 mg total) by mouth daily. 60 tablet 0   irbesartan (AVAPRO) 300 MG tablet Take 1 tablet (300 mg total) by mouth daily.     metFORMIN (GLUCOPHAGE) 1000 MG tablet Take 1,000 mg by mouth 2 (two) times daily.     topiramate (TOPAMAX) 50 MG tablet Take 1  tablet (50 mg total) by mouth 2 (two) times daily. 60 tablet 2   No current facility-administered medications on file prior to visit.    Allergies:  No Known Allergies    OBJECTIVE:  Physical Exam  Vitals:   01/01/21 1405  BP: 114/71  Pulse: 67  Weight: 184 lb (83.5 kg)  Height:  (1.778 m)   Body mass index is 26.4 kg/m. No results found.  General: well developed, well nourished, very pleasant elderly Caucasian male, seated, in no evident distress Head: head normocephalic and atraumatic.  Neck: supple with no carotid or supraclavicular bruits Cardiovascular: regular rate and rhythm, no murmurs Musculoskeletal: no deformity Skin:  no rash/petichiae Vascular:  Normal pulses all extremities   Neurologic Exam Mental Status: Awake and fully alert.   Fluent speech and language.  Oriented to place and time. Recent and remote memory intact. Attention span, concentration and fund of knowledge appropriate. Mood and affect appropriate.  Cranial Nerves: Pupils equal, briskly reactive to light. Extraocular movements full without nystagmus. Visual fields full to confrontation although subjective OD lower peripheral small area of visual loss. Hearing intact. Facial sensation intact. Face, tongue, palate moves normally and symmetrically.  Motor: Normal bulk and tone. Normal strength in all tested extremity muscles Sensory.: intact to touch , pinprick , position and vibratory sensation.  Coordination: Rapid alternating movements normal in all extremities. Finger-to-nose and heel-to-shin performed accurately bilaterally. Gait and Station: Arises from chair without difficulty. Stance is normal. Gait demonstrates normal stride length and balance without use of assistive device. Tandem walk and heel toe with mild to moderate difficulty.   Reflexes: 1+ and symmetric. Toes downgoing.        ASSESSMENT: Christopher Kirk is a 70 y.o. year old male with hx of bilateral multiple cerebellar,  occipital infarcts on 04/07/2020 with residual OD visual impairment likely from diffuse intracranial arthrosclerosis. Vascular risk factors include diffuse intracranial arthrosclerosis, HTN, HLD, DM, advanced age and former tobacco use. Eval by Dr. 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 and component of analgesic rebound with great improvement after starting topiramate     PLAN:  B/l cerebellar and L occipital strokes:  Continue clopidogrel 75 mg daily and atorvastatin 80 mg daily for secondary stroke prevention measures - ongoing refills per PCP  Discussed secondary stroke prevention measures and importance of close PCP follow up for aggressive stroke risk factor management including HTN with BP goal<130/90, HLD with LDL goal<70 and DM with A1c goal<7 mixed chronic headaches: Continue topiramate 50 mg twice daily - refill provided Multivessel intracranial arthrosclerosis: Continue high-dose atorvastatin and Plavix.  Discussed importance of managing stroke risk factors, healthy diet and adequate exercise At risk for sleep apnea: c/o excessive daytime fatigue with hx of stroke and HTN.  Discussed importance of ruling out sleep apnea for secondary stroke prevention measures but declines interest at this time.  Education regarding increased risks with untreated sleep apnea discussed.  Additional information provided.  He will further consider and call office if interested in pursuing in the future    Follow up in 6 months or call earlier if needed   CC:  PCP: Avva, Ravisankar, MD    I spent 34 minutes of face-to-face and non-face-to-face time with patient and wife.  This included previsit chart review, lab review, study review, order entry, electronic health record documentation, patient and wife education regarding prior stroke and etiology, residual deficits, importance of managing stroke risk factors and secondary stroke  prevention measures and mixed chronic headaches answered all other questions to patient and wife's satisfaction  Ihor Austin, AGNP-BC  [facility] [address] [place], [place] [address]  Phone [phone] Fax [phone] Note: This document was prepared with digital dictation and possible smart phrase technology. Any transcriptional errors that result from this process are unintentional.

## 2021-02-12 DIAGNOSIS — H53411 Scotoma involving central area, right eye: Secondary | ICD-10-CM | POA: Diagnosis not present

## 2021-02-23 DIAGNOSIS — E041 Nontoxic single thyroid nodule: Secondary | ICD-10-CM | POA: Diagnosis not present

## 2021-02-23 DIAGNOSIS — I10 Essential (primary) hypertension: Secondary | ICD-10-CM | POA: Diagnosis not present

## 2021-02-23 DIAGNOSIS — E785 Hyperlipidemia, unspecified: Secondary | ICD-10-CM | POA: Diagnosis not present

## 2021-02-23 DIAGNOSIS — E119 Type 2 diabetes mellitus without complications: Secondary | ICD-10-CM | POA: Diagnosis not present

## 2021-02-23 DIAGNOSIS — Z125 Encounter for screening for malignant neoplasm of prostate: Secondary | ICD-10-CM | POA: Diagnosis not present

## 2021-03-02 DIAGNOSIS — E041 Nontoxic single thyroid nodule: Secondary | ICD-10-CM | POA: Diagnosis not present

## 2021-03-02 DIAGNOSIS — Z0000 Encounter for general adult medical examination without abnormal findings: Secondary | ICD-10-CM | POA: Diagnosis not present

## 2021-03-02 DIAGNOSIS — K279 Peptic ulcer, site unspecified, unspecified as acute or chronic, without hemorrhage or perforation: Secondary | ICD-10-CM | POA: Diagnosis not present

## 2021-03-02 DIAGNOSIS — I10 Essential (primary) hypertension: Secondary | ICD-10-CM | POA: Diagnosis not present

## 2021-03-02 DIAGNOSIS — R82998 Other abnormal findings in urine: Secondary | ICD-10-CM | POA: Diagnosis not present

## 2021-03-02 DIAGNOSIS — E785 Hyperlipidemia, unspecified: Secondary | ICD-10-CM | POA: Diagnosis not present

## 2021-03-02 DIAGNOSIS — Z1331 Encounter for screening for depression: Secondary | ICD-10-CM | POA: Diagnosis not present

## 2021-03-02 DIAGNOSIS — R634 Abnormal weight loss: Secondary | ICD-10-CM | POA: Diagnosis not present

## 2021-03-02 DIAGNOSIS — I63211 Cerebral infarction due to unspecified occlusion or stenosis of right vertebral arteries: Secondary | ICD-10-CM | POA: Diagnosis not present

## 2021-03-02 DIAGNOSIS — Z1212 Encounter for screening for malignant neoplasm of rectum: Secondary | ICD-10-CM | POA: Diagnosis not present

## 2021-03-02 DIAGNOSIS — E119 Type 2 diabetes mellitus without complications: Secondary | ICD-10-CM | POA: Diagnosis not present

## 2021-03-02 DIAGNOSIS — R519 Headache, unspecified: Secondary | ICD-10-CM | POA: Diagnosis not present

## 2021-03-02 DIAGNOSIS — Z1339 Encounter for screening examination for other mental health and behavioral disorders: Secondary | ICD-10-CM | POA: Diagnosis not present

## 2021-03-02 DIAGNOSIS — Z23 Encounter for immunization: Secondary | ICD-10-CM | POA: Diagnosis not present

## 2021-03-27 DIAGNOSIS — R634 Abnormal weight loss: Secondary | ICD-10-CM | POA: Diagnosis not present

## 2021-03-27 DIAGNOSIS — E119 Type 2 diabetes mellitus without complications: Secondary | ICD-10-CM | POA: Diagnosis not present

## 2021-03-27 DIAGNOSIS — K279 Peptic ulcer, site unspecified, unspecified as acute or chronic, without hemorrhage or perforation: Secondary | ICD-10-CM | POA: Diagnosis not present

## 2021-03-27 DIAGNOSIS — I10 Essential (primary) hypertension: Secondary | ICD-10-CM | POA: Diagnosis not present

## 2021-04-23 ENCOUNTER — Other Ambulatory Visit: Payer: Self-pay | Admitting: Adult Health

## 2021-05-07 IMAGING — MR MR HEAD W/O CM
9 of 10 series · 36 of 48 positions shown · non-contrast
Comparison: None.

CLINICAL DATA: Diabetes and hypertension. Acute stroke
presentation.

EXAM:
MRI HEAD WITHOUT CONTRAST
TECHNIQUE: Multiplanar, multiecho pulse sequences of the brain and surrounding
structures were obtained without intravenous contrast.

[Series 3: DWI · axial · 3.0mm · 1.09mm/px · z∈[-94,+62]mm · 9 of 108 slices shown (1 of 4)]
[im 1/108]
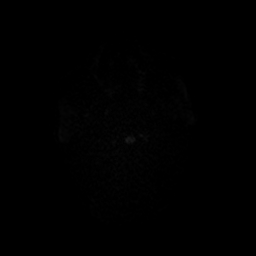
[im 20/108]
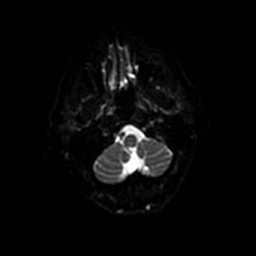
[im 30/108]
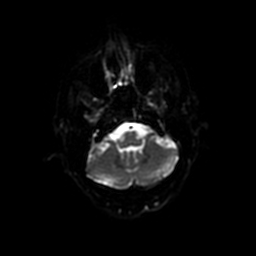
[im 49/108]
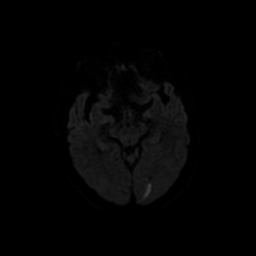
[im 59/108]
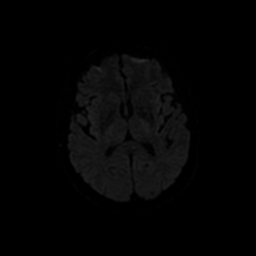
[im 78/108]
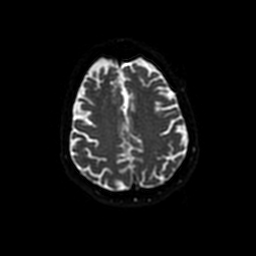
[im 88/108]
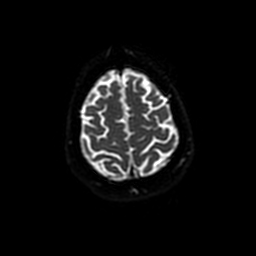
[im 98/108]
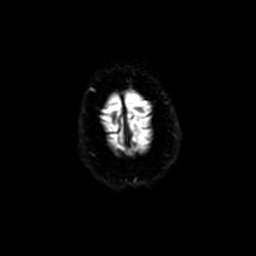
[im 108/108]
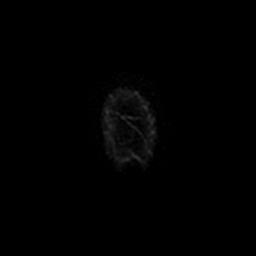

[Series 4: DWI · coronal · 5.0mm · 1.09mm/px · 8 of 72 slices shown (2 of 4)]
[im 1/72]
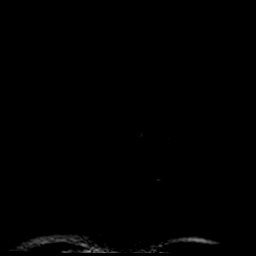
[im 11/72]
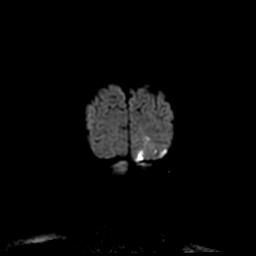
[im 21/72]
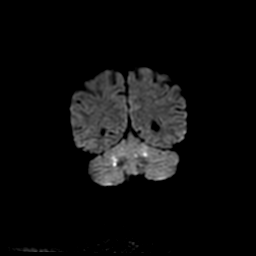
[im 31/72]
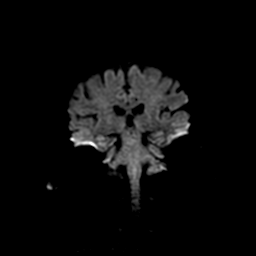
[im 41/72]
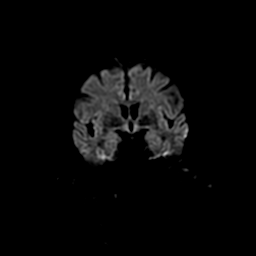
[im 51/72]
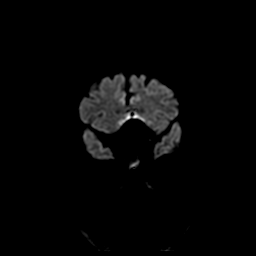
[im 61/72]
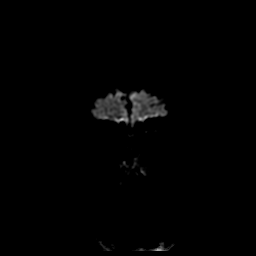
[im 72/72]
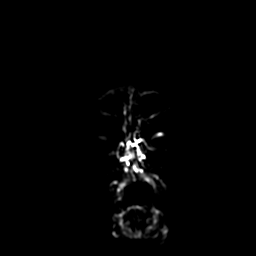

[Series 5: T1 · sagittal · 5.0mm · 0.47mm/px · 2 of 23 slices shown (1 of 2)]
[im 1/23]
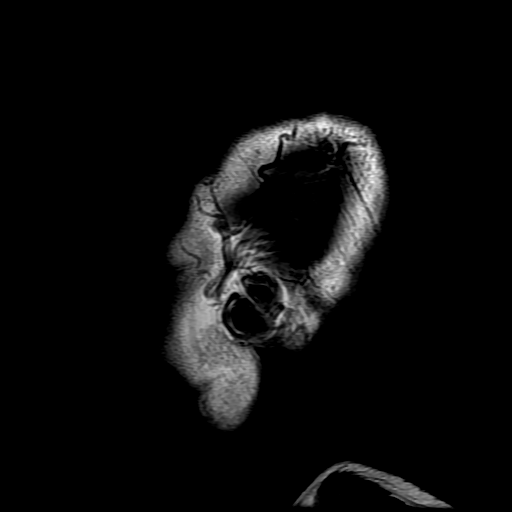
[im 23/23]
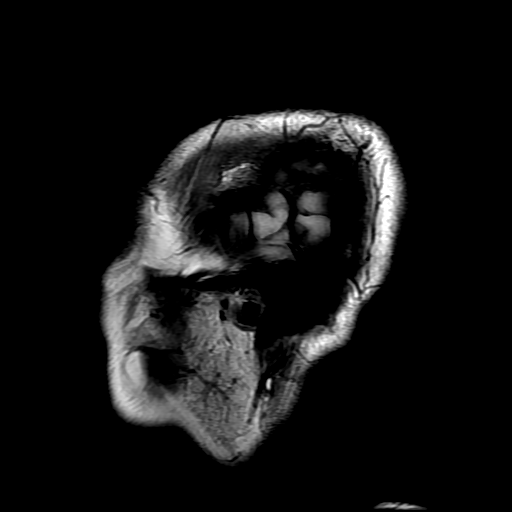

[Series 6: T2 · axial · 5.0mm · 0.43mm/px · z∈[-97,+46]mm · 2 of 25 slices shown (1 of 2)]
[im 1/25]
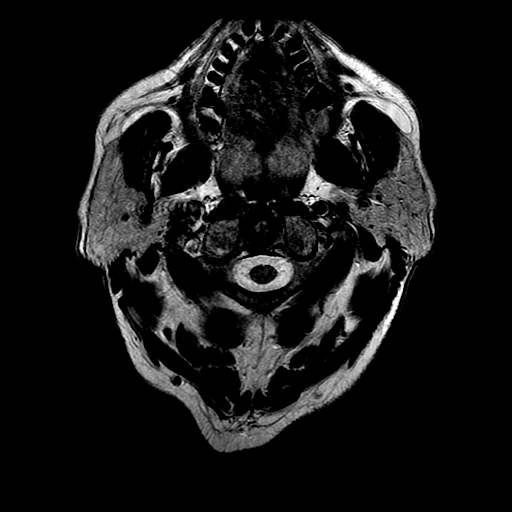
[im 25/25]
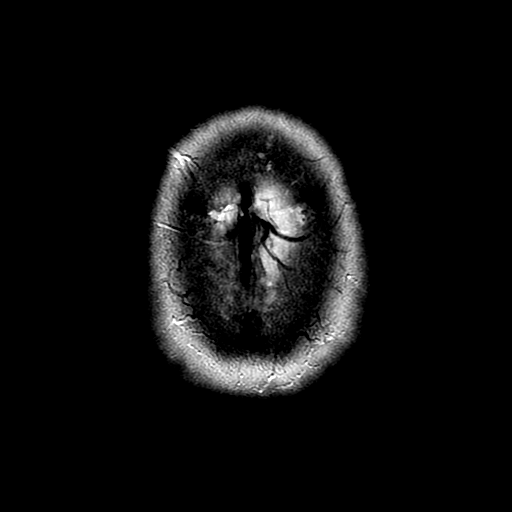

[Series 7: FLAIR · axial · 3.0mm · 0.43mm/px · z∈[-97,+46]mm · 2 of 25 slices shown]
[im 1/25]
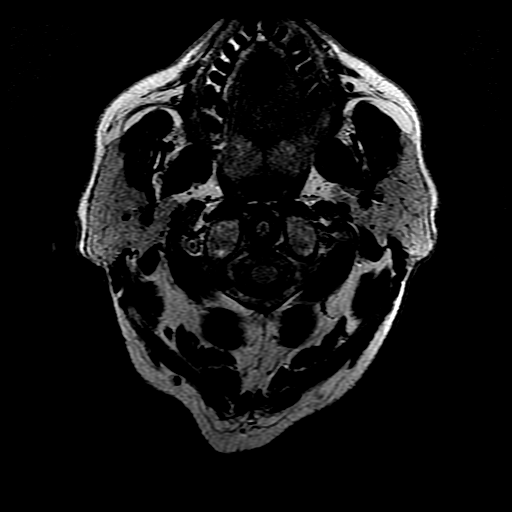
[im 25/25]
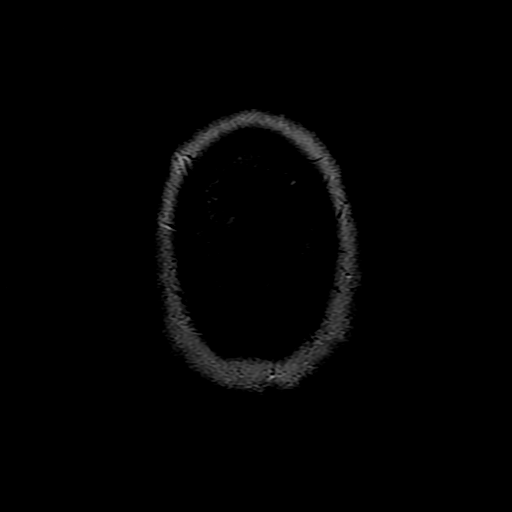

[Series 9: T1 · axial · 3.0mm · 0.47mm/px · z∈[-98,-49]mm · 3 of 100 slices shown (2 of 2)]
[im 1/100]
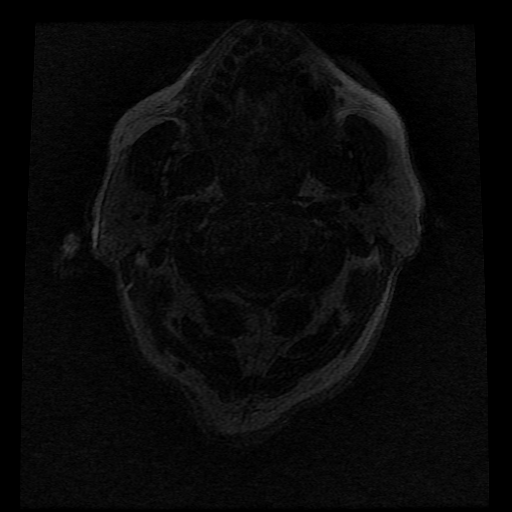
[im 12/100]
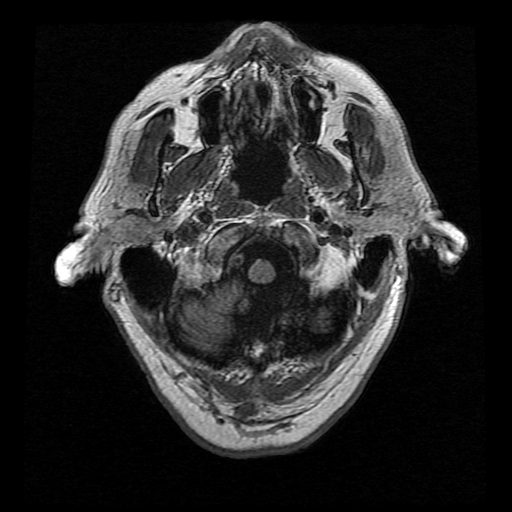
[im 34/100]
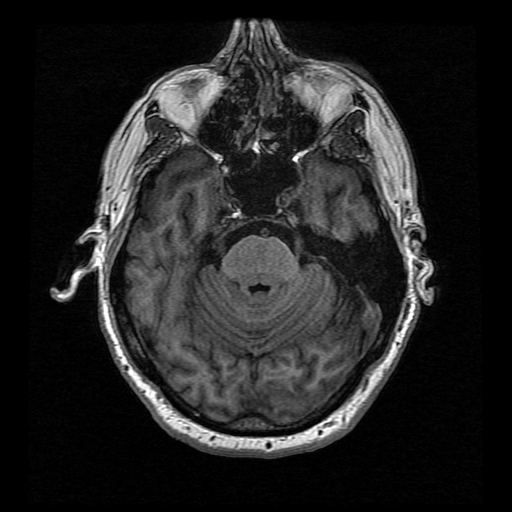

[Series 10: T2 · coronal · 5.0mm · 0.39mm/px · 2 of 25 slices shown (2 of 2)]
[im 1/25]
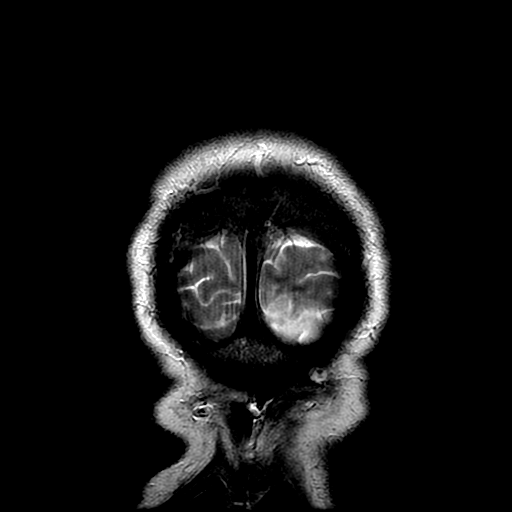
[im 25/25]
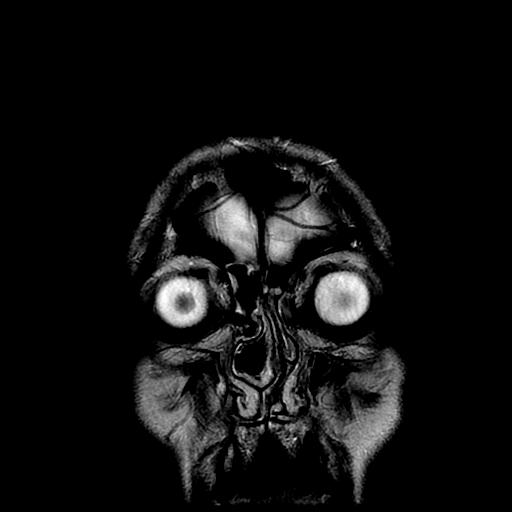

[Series 300: DWI · axial · 3.0mm · 1.09mm/px · z∈[-94,+62]mm · 5 of 54 slices shown (3 of 4)]
[im 1/54]
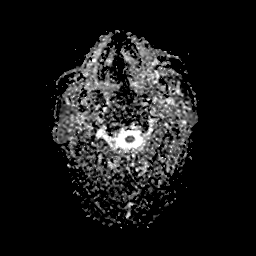
[im 14/54]
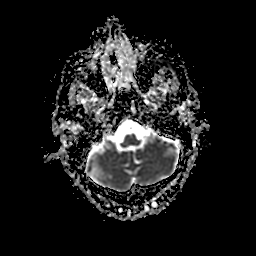
[im 27/54]
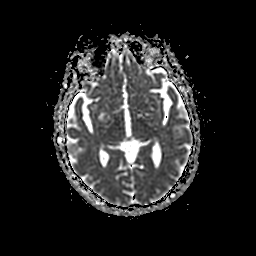
[im 40/54]
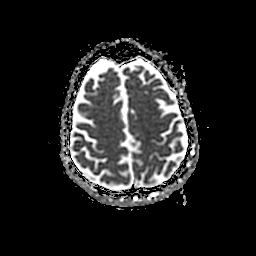
[im 54/54]
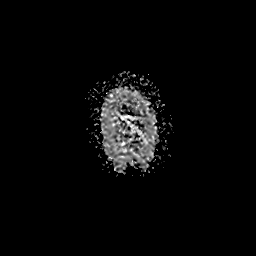

[Series 400: DWI · coronal · 5.0mm · 1.09mm/px · 3 of 35 slices shown (4 of 4)]
[im 1/35]
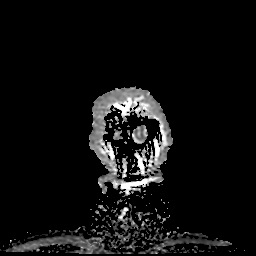
[im 18/35]
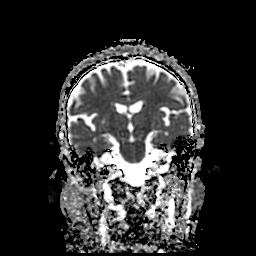
[im 35/35]
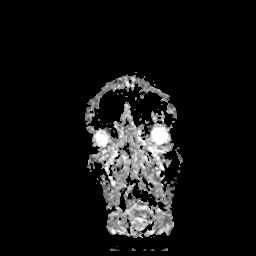

[36 of 48 positions shown; findings below may reference images not displayed]

FINDINGS: Brain: Diffusion imaging shows multiple scattered small acute
infarctions within both cerebellar hemispheres. There is a larger
confluent infarction affecting the left occipital lobe measuring up
to 3 cm in size. No evidence of acute anterior circulation insult.
No evidence of hemorrhage or significant swelling/mass effect.
Elsewhere, there is mild age related volume loss with minimal small
vessel change of the cerebral hemispheric white matter. No
hydrocephalus.

Vascular: Flow is present in both carotid arteries. There is
abnormal flow in the posterior circulation. Left vertebral artery is
probably a tiny vessel that terminates in PICA. Dominant right
vertebral artery shows atherosclerosis and does not show abnormal
flow. There is also not normal flow in the proximal basilar artery.

Skull and upper cervical spine: Negative

Sinuses/Orbits: Clear/normal

Other: None
IMPRESSION: 1. Multiple scattered small acute infarctions within both cerebellar
hemispheres. Larger confluent infarction affecting the left
occipital lobe measuring up to 3 cm in size. No evidence of
hemorrhage or mass effect.
2. Abnormal flow in the posterior circulation. Left vertebral artery
is probably a tiny vessel that terminates in PICA. There is an
abnormal flow appearance of the dominant right vertebral artery and
proximal basilar artery.

## 2021-07-04 ENCOUNTER — Encounter: Payer: Self-pay | Admitting: Adult Health

## 2021-07-04 ENCOUNTER — Ambulatory Visit: Payer: Medicare HMO | Admitting: Adult Health

## 2021-07-04 VITALS — BP 124/77 | HR 64 | Ht 70.0 in | Wt 180.0 lb

## 2021-07-04 DIAGNOSIS — I639 Cerebral infarction, unspecified: Secondary | ICD-10-CM

## 2021-07-04 DIAGNOSIS — G4489 Other headache syndrome: Secondary | ICD-10-CM

## 2021-07-04 MED ORDER — TOPIRAMATE 50 MG PO TABS: 50.0000 mg | ORAL_TABLET | Freq: Every day | ORAL | 0 refills | Status: DC

## 2021-07-04 NOTE — Patient Instructions (Signed)
Continue clopidogrel 75 mg daily  and atorvastatin for secondary stroke prevention  We will try to decrease to topamax 50mg  nightly for 1 month then stop. If you start to experience headaches with reducing dose, let me know and we will restart medication   Continue to follow up with PCP regarding cholesterol and blood pressure management  Maintain strict control of hypertension with blood pressure goal below 130/90 and cholesterol with LDL cholesterol (bad cholesterol) goal below 70 mg/dL.   Signs of a Stroke? Follow the BEFAST method:  Balance Watch for a sudden loss of balance, trouble with coordination or vertigo Eyes Is there a sudden loss of vision in one or both eyes? Or double vision?  Face: Ask the person to smile. Does one side of the face droop or is it numb?  Arms: Ask the person to raise both arms. Does one arm drift downward? Is there weakness or numbness of a leg? Speech: Ask the person to repeat a simple phrase. Does the speech sound slurred/strange? Is the person confused ? Time: If you observe any of these signs, call 911.     Followup in the future with me in 6 months or call earlier if needed      Thank you for coming to see at [facility]. I hope we have been able to provide you high quality care today.  You may receive a patient satisfaction survey over the next few weeks. We would appreciate your feedback and comments so that we may continue to improve ourselves and the health of our patients.

## 2021-07-04 NOTE — Progress Notes (Signed)
[facility] [address] [place]. [place] [address] [phone]       STROKE FOLLOW UP NOTE  Christopher Kirk Date of Birth:  October 29, 1950 Medical Record Number:  [id number]   Reason for Referral: stroke follow up    SUBJECTIVE:   CHIEF COMPLAINT:  Chief Complaint  Patient presents with   Follow-up    RM 3 with spouse Christopher Kirk Pt is well and stable, no new concerns      HPI:   Update 07/04/2021 JM: Patient returns for 64-month stroke follow-up accompanied by his wife.  Overall doing well since prior visit.  Denies new stroke/TIA symptoms.  Headaches have been stable, has mild headaches only occasionally with ongoing use of topiramate 50 mg twice daily.  He tries to stay active during the day but can still tire easily, he is better about taking breaks when needed. Continues gait impairment but stable since prior visit, denies any recent falls.  Continued right lower vision impairment, believes this has improved some, routinely follows with ophthalmology.  Denies new stroke/TIA symptoms.  Compliant on Plavix and atorvastatin, denies side effects.  Blood pressure today 124/77.  PCP has been making medication changes to blood pressure regimen as well as diabetic regimen. Completed lab work in January with PCP - unable to view via epic. Is scheduled Friday with PCP for follow up.  No new concerns at this time.      History provided for reference purposes only Update 01/01/2021 JM: Returns for follow-up after prior visit with Dr. Leonie Man for new daily persistent headaches 3 months ago and prior stroke history.  He is accompanied today by his wife.  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 Reports great improvement of headaches currently experiencing 1 to 2/month only lasting a few hours after taking Tylenol.  He denies any continued analgesic overuse.  He has  tolerated topiramate without side effects.  Stable from stroke standpoint without new stroke/TIA symptoms Continued imbalance but per patient, currently at baseline with chronic gait impairment/imbalance due to chronic lumbar issues. Continued OD lower outer edge visual impairment which has been stable.  Routinely followed by ophthalmology.  Does not interfere with daily functioning or activity. Reports continued day time fatigue (not worsened since starting Topamax).  Initially reported he sleeps well at night but then reported he will have to wake up at times in the middle the night to let the dogs out or go to the bathroom. He does not feel rested upon awakening in the morning.  Occasionally awakens with headache.  Has not previously underwent sleep study.  Compliant on Plavix and atorvastatin -denies side effects Blood pressure today 114/71 - occasionally monitors at home which has been stable  No new concerns at this time  Update 10/03/2020 Dr. Leonie Man: Christopher Kirk is a 71 year old Caucasian male seen today for office consultation visit for headaches and dizziness.  Is accompanied by his wife.  History is obtained from them and review of electronic medical records and personally reviewed pertinent available imaging films in PACS.Christopher Kirk is a 71 y.o. 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  Patient has had  residual right-sided peripheral visual difficulties as well as gait and balance issues.  He is referred back to see me today for mostly because of concerns about headache.  Patient states that for the last 2 -3 weeks he has had a daily headache.   Headache starts mostly in the back and occasionally goes to the front.  It varies in severity from 3/10-8/10.  At times it is dull in times it is quite throbbing and severe.  There is some light sensitivity and sound sensitivity.  He does have prior history of what sound like migraine headaches most of his life but they have been infrequent.  Usually finds relief with Aleve or migraine Excedrin.  He has been taking 1 or 2 of these tablets daily for the last 2 weeks with headaches keep on coming back.  The headaches are tolerable if he takes his pills and is able to work but they are not going away.  He had an MRI scan of the brain done on 09/28/2020 which shows no acute abnormalities.  Old left occipital and basal ganglia infarcts are noted.  MR angiogram of the brain shows diminutive basilar artery with possible mid basilar occlusion and left posterior cerebral artery also is occluded and right posterior cerebral artery has moderate stenosis at P1 P2 junction.  He had lab work done on 06/22/2020 which showed optimal LDL cholesterol at 59 mg percent and hemoglobin A1c at 5.2.  He was on aspirin and Plavix for 3 months following his stroke in March and subsequently on aspirin alone but was recently put back on Plavix by his primary physician few weeks ago.  Patient states he is recovered well from his stroke has some mild residual balance difficulties which seem to be more pronounced when he is fatigued.  He has some slight peripheral vision difficulty in the right eye as well.  His blood pressure is well controlled and today is 125/84.  He is tolerating his Lipitor well without any side effects.    Initial visit 05/10/2020 JM: Christopher Kirk is being seen for hospital follow-up accompanied by his wife, Christopher Kirk  Reports residual imbalance, visual impairment and fatigue with some improvement and has been slowly returning back to prior activities.  He does tire quicker and less activity tolerance since his stroke.   Evaluated by his ophthalmologist and per patient, has small area of visual loss in just his right eye in the bottom outer edge but has been slowly improving.  Compliant on aspirin and Plavix without associated side effects - he does need refill on plavix He is currently on pravastatin as he was only provided 30 days of atorvastatin -he was tolerating atorvastatin without side effects Blood pressure today 119/75   No further concerns at this time  Stroke admission 11/07/2020 Christopher Kirk is a 71 yo male with PMH of DM, HTN, HLD who presented on 11/07/2020 after transient episode of left sided weakness x 2 and episode of "wonky vision" and R sided headache with resolution of his neuro symptoms but persistent right sided headache.   Personally reviewed hospitalization pertinent progress notes, lab work and imaging with summary provided.  Evaluated by Dr. Leonie Man with stroke work-up revealing scattered embolic appearing posterior circulation strokes with multifocal multivessel intracranial stenosis, most notably distal right vertebral artery occlusion, diminutive basilar artery and bilateral posterior cerebral artery atherosclerosis.  Recommended DAPT for 3 months and aspirin alone.  HTN stable.  LDL 81 and switch pravastatin to atorvastatin 80 mg daily.  Other stroke risk factors include advanced age and former tobacco use but no prior stroke history.  Other active problems include multinodular thyroid gland noted on imaging and recommended follow-up outpatient.  Evaluated by therapies and discharged home in stable condition without therapy needs  Scattered embolic appearing posterior circulation strokes with multifocal multivessel intracranial stenosis, most notably distal right vertebral artery occlusion, diminutive basilar artery and bilateral posterior cerebral artery atherosclerosis. Code Stroke note done, MRI obtained immediately instead.  CTA head & neck: multivessel intracranial stenosis. Mild  atherosclerotic changes of the carotid bifurcations without  evidence of stenosis (50% or greater) or occlusion MRI  Brain: Multiple scattered small acute infarctions within both cerebellar hemispheres. Larger confluent infarction affecting the left occipital lobe measuring up to 3 cm in size. No evidence of hemorrhage or mass effect. 2D Echo EF 60 to 65% without evidence of embolism LDL 81 HgbA1c 6.1 VTE prophylaxis - Lovenox 40mg  daily  No antiplatelet or anticoagulant prior to admission, now on ASA 81 mg and Plavix 75mg  x 3 months then ASA alone  Therapy recommendations:  Cleared for home Disposition:  Cleared for home       ROS:   14 system review of systems performed and negative with exception of those listed in HPI  PMH:  Past Medical History:  Diagnosis Date   CVA (cerebral vascular accident) ([place]) 04/07/2020   Diabetes mellitus without complication ([place])    Dyslipidemia    GERD (gastroesophageal reflux disease)    Hypertension     PSH:  Past Surgical History:  Procedure Laterality Date   BACK SURGERY      Social History:  Social History   Socioeconomic History   Marital status: Married    Spouse name: Not on file   Number of children: Not on file   Years of education: Not on file   Highest education level: Not on file  Occupational History   Occupation: retired  Tobacco Use   Smoking status: Former    Packs/day: 3.00    Years: 20.00    Pack years: 60.00    Types: Cigarettes    Quit date: 1987    Years since quitting: 36.4   Smokeless tobacco: Never  Substance and Sexual Activity   Alcohol use: Yes    Comment: occ   Drug use: Never   Sexual activity: Not on file  Other Topics Concern   Not on file  Social History Narrative   Not on file   Social Determinants of Health   Financial Resource Strain: Not on file  Food Insecurity: Not on file  Transportation Needs: Not on file  Physical Activity: Not on file  Stress: Not on file  Social  Connections: Not on file  Intimate Partner Violence: Not on file    Family History:  Family History  Problem Relation Age of Onset   Stroke Father    Stroke Sister    Stroke Brother        x 3 brothers    Medications:   Current Outpatient Medications on File Prior to Visit  Medication Sig Dispense Refill   amLODipine (NORVASC) 10 MG tablet Take 1 tablet (10 mg total) by mouth daily. (Patient taking differently: Take 10 mg by mouth daily. 1/2 tab daily)     atorvastatin (LIPITOR) 80 MG tablet Take 1 tablet (80 mg total) by mouth daily. 90 tablet 3   carvedilol (COREG) 6.25 MG tablet Take 1 tablet (6.25 mg total) by mouth 2 (two) times daily.  clopidogrel (PLAVIX) 75 MG tablet Take 1 tablet (75 mg total) by mouth daily. 60 tablet 0   irbesartan (AVAPRO) 300 MG tablet Take 1 tablet (300 mg total) by mouth daily.     metFORMIN (GLUCOPHAGE) 1000 MG tablet Take 1,000 mg by mouth 2 (two) times daily. 1/2 tab daily     topiramate (TOPAMAX) 50 MG tablet Take 1 tablet (50 mg total) by mouth 2 (two) times daily. 180 tablet 3   No current facility-administered medications on file prior to visit.    Allergies:  No Known Allergies    OBJECTIVE:  Physical Exam  Vitals:   07/04/21 1410  BP: 124/77  Pulse: 64  Weight: 180 lb (81.6 kg)  Height: 5\' 10"  (1.778 m)   Body mass index is 25.83 kg/m. No results found.   General: well developed, well nourished, very pleasant elderly Caucasian male, seated, in no evident distress Head: head normocephalic and atraumatic.   Neck: supple with no carotid or supraclavicular bruits Cardiovascular: regular rate and rhythm, no murmurs Musculoskeletal: no deformity Skin:  no rash/petichiae Vascular:  Normal pulses all extremities   Neurologic Exam Mental Status: Awake and fully alert.   Fluent speech and language.  Oriented to place and time. Recent and remote memory intact. Attention span, concentration and fund of knowledge appropriate. Mood  and affect appropriate.  Cranial Nerves: Pupils equal, briskly reactive to light. Extraocular movements full without nystagmus. Visual fields full to confrontation although subjective OD lower peripheral small area of visual loss. Hearing intact. Facial sensation intact. Face, tongue, palate moves normally and symmetrically.  Motor: Normal bulk and tone. Normal strength in all tested extremity muscles Sensory.: intact to touch , pinprick , position and vibratory sensation.  Coordination: Rapid alternating movements normal in all extremities. Finger-to-nose and heel-to-shin performed accurately bilaterally. Gait and Station: Arises from chair without difficulty. Stance is normal. Gait demonstrates normal stride length and mild balance without use of assistive device. Tandem walk and heel toe with mild to moderate difficulty.   Reflexes: 1+ and symmetric. Toes downgoing.        ASSESSMENT: Christopher Kirk is a 71 y.o. 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 Vascular risk factors include diffuse intracranial arthrosclerosis, HTN, HLD, DM, advanced age and former tobacco use. Eval by Dr. 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 and component of analgesic rebound with great improvement after starting topiramate     PLAN:  B/l cerebellar and L occipital strokes:  Continue clopidogrel 75 mg daily and atorvastatin 80 mg daily for secondary stroke prevention measures - ongoing refills per PCP  Discussed secondary stroke prevention measures and importance of close PCP follow up for aggressive stroke risk factor management including HTN with BP goal<130/90, HLD with LDL goal<70 and DM with A1c goal<7 mixed chronic headaches: as headaches greatly improved with only rare occasional headaches, recommend trying  to decrease topiramate dosage to 50 mg nightly for 1 month and then discontinue.  Advised to call if headache should worsen with tapering off medication for likely need of restart.     Follow up in 6 months or call earlier if needed - if remains stable, can follow-up as needed   CC:  PCP: Avva, Ravisankar, MD    I spent 31 minutes of face-to-face and non-face-to-face time with patient and wife.  This included previsit chart review,  lab review, study review, order entry, electronic health record documentation, patient and wife education regarding prior stroke and etiology, residual deficits, importance of managing stroke risk factors and secondary stroke prevention measures and mixed chronic headaches answered all other questions to patient and wife's satisfaction  Frann Rider, AGNP-BC  [facility] [address] [place],  [address]  Phone [phone] Fax [phone] Note: This document was prepared with digital dictation and possible smart phrase technology. Any transcriptional errors that result from this process are unintentional.

## 2021-07-06 DIAGNOSIS — R2681 Unsteadiness on feet: Secondary | ICD-10-CM | POA: Diagnosis not present

## 2021-07-06 DIAGNOSIS — E119 Type 2 diabetes mellitus without complications: Secondary | ICD-10-CM | POA: Diagnosis not present

## 2021-07-06 DIAGNOSIS — I10 Essential (primary) hypertension: Secondary | ICD-10-CM | POA: Diagnosis not present

## 2021-07-06 DIAGNOSIS — E785 Hyperlipidemia, unspecified: Secondary | ICD-10-CM | POA: Diagnosis not present

## 2021-07-06 DIAGNOSIS — M5380 Other specified dorsopathies, site unspecified: Secondary | ICD-10-CM | POA: Diagnosis not present

## 2021-07-06 DIAGNOSIS — K279 Peptic ulcer, site unspecified, unspecified as acute or chronic, without hemorrhage or perforation: Secondary | ICD-10-CM | POA: Diagnosis not present

## 2021-07-06 DIAGNOSIS — E041 Nontoxic single thyroid nodule: Secondary | ICD-10-CM | POA: Diagnosis not present

## 2021-07-06 DIAGNOSIS — R519 Headache, unspecified: Secondary | ICD-10-CM | POA: Diagnosis not present

## 2021-07-06 DIAGNOSIS — I63211 Cerebral infarction due to unspecified occlusion or stenosis of right vertebral arteries: Secondary | ICD-10-CM | POA: Diagnosis not present

## 2021-08-06 ENCOUNTER — Other Ambulatory Visit: Payer: Self-pay | Admitting: Adult Health

## 2021-09-11 DIAGNOSIS — I10 Essential (primary) hypertension: Secondary | ICD-10-CM | POA: Diagnosis not present

## 2021-09-11 DIAGNOSIS — E119 Type 2 diabetes mellitus without complications: Secondary | ICD-10-CM | POA: Diagnosis not present

## 2021-09-11 DIAGNOSIS — R634 Abnormal weight loss: Secondary | ICD-10-CM | POA: Diagnosis not present

## 2021-11-23 DIAGNOSIS — H2513 Age-related nuclear cataract, bilateral: Secondary | ICD-10-CM | POA: Diagnosis not present

## 2021-11-23 DIAGNOSIS — H524 Presbyopia: Secondary | ICD-10-CM | POA: Diagnosis not present

## 2021-11-23 DIAGNOSIS — E119 Type 2 diabetes mellitus without complications: Secondary | ICD-10-CM | POA: Diagnosis not present

## 2021-12-03 DIAGNOSIS — E119 Type 2 diabetes mellitus without complications: Secondary | ICD-10-CM | POA: Diagnosis not present

## 2021-12-03 DIAGNOSIS — K279 Peptic ulcer, site unspecified, unspecified as acute or chronic, without hemorrhage or perforation: Secondary | ICD-10-CM | POA: Diagnosis not present

## 2021-12-03 DIAGNOSIS — I10 Essential (primary) hypertension: Secondary | ICD-10-CM | POA: Diagnosis not present

## 2021-12-03 DIAGNOSIS — I63211 Cerebral infarction due to unspecified occlusion or stenosis of right vertebral arteries: Secondary | ICD-10-CM | POA: Diagnosis not present

## 2021-12-03 DIAGNOSIS — M5380 Other specified dorsopathies, site unspecified: Secondary | ICD-10-CM | POA: Diagnosis not present

## 2021-12-03 DIAGNOSIS — R2681 Unsteadiness on feet: Secondary | ICD-10-CM | POA: Diagnosis not present

## 2021-12-03 DIAGNOSIS — E041 Nontoxic single thyroid nodule: Secondary | ICD-10-CM | POA: Diagnosis not present

## 2021-12-03 DIAGNOSIS — E785 Hyperlipidemia, unspecified: Secondary | ICD-10-CM | POA: Diagnosis not present

## 2021-12-03 DIAGNOSIS — Z23 Encounter for immunization: Secondary | ICD-10-CM | POA: Diagnosis not present

## 2021-12-03 DIAGNOSIS — R634 Abnormal weight loss: Secondary | ICD-10-CM | POA: Diagnosis not present

## 2021-12-03 DIAGNOSIS — R519 Headache, unspecified: Secondary | ICD-10-CM | POA: Diagnosis not present

## 2021-12-29 ENCOUNTER — Other Ambulatory Visit: Payer: Self-pay | Admitting: Adult Health

## 2022-01-03 NOTE — Progress Notes (Signed)
[facility] [address] [place]. [place] [address] [phone]       STROKE FOLLOW UP NOTE  [name] Date of Birth:  [birth year] Medical Record Number:  [id number]   Reason for Referral: stroke follow up    SUBJECTIVE:   CHIEF COMPLAINT:  Chief Complaint  Patient presents with   Room 3    Pt sis here with his Wife. Pt states no new symptoms since last appointment.  Pt's wife states that he's still getting headaches.      HPI:   Update 01/07/2022 JM: Returns for 56-month follow-up accompanied by his wife.  Overall stable.  No new stroke/TIA symptoms.  Has remained on topamax  nightly, about 3-4 headaches per month. Usually will awaken with them, will eventually resolve on their own, can last all day at times. Not debilitating.  Continued gait impairment, stable. Wife mentions issues with diarrhea, patient believes possibly from one of his evening medications, at times will forget to take evening meds and diarrhea will be better the following day. PCP did recently adjust metformin dosage but denies any improvement of diarrhea.   Remains on Plavix and atorvastatin Blood pressure well-controlled Routinely follows with PCP Dr. Felipa Eth, recent visit, has f/u in February again for annual physical     History provided for reference purposes only Update 07/04/2021 JM: Patient returns for 23-month stroke follow-up accompanied by his wife.  Overall doing well since prior visit.  Denies new stroke/TIA symptoms.  Headaches have been stable, has mild headaches only occasionally with ongoing use of topiramate 50 mg twice daily.  He tries to stay active during the day but can still tire easily, he is better about taking breaks when needed. Continues gait impairment but stable since prior visit, denies any recent falls.  Continued right lower vision impairment, believes this has improved some, routinely follows with ophthalmology.  Denies new stroke/TIA symptoms.   Compliant on Plavix and atorvastatin, denies side effects.  Blood pressure today 124/77.  PCP has been making medication changes to blood pressure regimen as well as diabetic regimen. Completed lab work in January with PCP - unable to view via epic. Is scheduled Friday with PCP for follow up.  No new concerns at this time.   Update 01/01/2021 JM: Returns for follow-up after prior visit with Dr. Pearlean Brownie for new daily persistent headaches 3 months ago and prior stroke history.  He is accompanied today by his wife.  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 Reports great improvement of headaches currently experiencing 1 to 2/month only lasting a few hours after taking Tylenol.  He denies any continued analgesic overuse.  He has tolerated topiramate without side effects.  Stable from stroke standpoint without new stroke/TIA symptoms Continued imbalance but per patient, currently at baseline with chronic gait impairment/imbalance due to chronic lumbar issues. Continued OD lower outer edge visual impairment which has been stable.  Routinely followed by ophthalmology.  Does not interfere with daily functioning or activity. Reports continued day time fatigue (not worsened since starting Topamax).  Initially reported he sleeps well at night but then reported he will have to wake up at times in the middle the night to let the dogs out or go to the bathroom. He does not feel rested upon awakening in the morning.  Occasionally awakens with headache.  Has not previously underwent sleep study.  Compliant on Plavix  and atorvastatin -denies side effects Blood pressure today 114/71 - occasionally monitors at home which has been stable  No new concerns at this time  Update 10/03/2020 Dr. Pearlean BrownieSethi: [name] is a 71 year old Caucasian male seen today for office consultation visit for headaches and dizziness.  Is  accompanied by his wife.  History is obtained from them and review of electronic medical records and personally reviewed pertinent available imaging films in PACS.Christopher Kirk is a 71 y.o. 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  Patient has had residual right-sided peripheral visual difficulties as well as gait and balance issues.  He is referred back to see me today for mostly because of concerns about headache.  Patient states that for the last 2 -3 weeks he has had a daily headache.  Headache starts mostly in the back and occasionally goes to the front.  It varies in severity from 3/10-8/10.  At times it is dull in times it is quite throbbing and severe.  There is some light sensitivity and sound sensitivity.  He does have prior history of what sound like migraine headaches most of his life but they have been infrequent.  Usually finds relief with Aleve or migraine Excedrin.  He has been taking 1 or 2 of these tablets daily for the last 2 weeks with headaches keep on coming back.  The headaches are tolerable if he takes his pills and is able to work but they are not going away.  He had an MRI scan of the brain done on 09/28/2020 which shows no acute abnormalities.  Old left occipital and basal ganglia infarcts are noted.  MR angiogram of the brain shows diminutive basilar artery with possible mid basilar occlusion and left posterior cerebral artery also is occluded and right posterior cerebral artery has moderate stenosis at P1 P2 junction.  He had lab work done on 06/22/2020 which showed optimal LDL cholesterol at 59 mg percent and hemoglobin A1c at 5.2.  He was on  aspirin and Plavix for 3 months following his stroke in March and subsequently on aspirin alone but was recently put back on Plavix by his primary physician few weeks ago.  Patient states he is recovered well from his stroke has some mild residual balance difficulties which seem to be more pronounced when he is fatigued.  He has some slight peripheral vision difficulty in the right eye as well.  His blood pressure is well controlled and today is 125/84.  He is tolerating his Lipitor well without any side effects.    Initial visit 05/10/2020 JM: [name] is being seen for hospital follow-up accompanied by his wife, Gavin PoundDeborah  Reports residual imbalance, visual impairment and fatigue with some improvement and has been slowly returning back to prior activities.  He does tire quicker and less activity tolerance since his stroke.  Evaluated by his ophthalmologist and per patient, has small area of visual loss in just his right eye in the bottom outer edge but has been slowly improving.  Compliant on aspirin and Plavix without associated side effects - he does need refill on plavix He is currently on pravastatin as he was only provided 30 days of atorvastatin -he was tolerating atorvastatin without side effects Blood pressure today  119/75   No further concerns at this time  Stroke admission 11/07/2020 Christopher Kirk is a 71 yo male with PMH of DM, HTN, HLD who presented on 11/07/2020 after transient episode of left sided weakness x 2 and episode of "wonky vision" and R sided headache with resolution of his neuro symptoms but persistent right sided headache.   Personally reviewed hospitalization pertinent progress notes, lab work and imaging with summary provided.  Evaluated by Dr. Pearlean Brownie with stroke work-up revealing scattered embolic appearing posterior circulation strokes with multifocal multivessel intracranial stenosis, most notably distal right vertebral artery occlusion, diminutive basilar artery and  bilateral posterior cerebral artery atherosclerosis.  Recommended DAPT for 3 months and aspirin alone.  HTN stable.  LDL 81 and switch pravastatin to atorvastatin 80 mg daily.  Other stroke risk factors include advanced age and former tobacco use but no prior stroke history.  Other active problems include multinodular thyroid gland noted on imaging and recommended follow-up outpatient.  Evaluated by therapies and discharged home in stable condition without therapy needs  Scattered embolic appearing posterior circulation strokes with multifocal multivessel intracranial stenosis, most notably distal right vertebral artery occlusion, diminutive basilar artery and bilateral posterior cerebral artery atherosclerosis. Code Stroke note done, MRI obtained immediately instead.  CTA head & neck: multivessel intracranial stenosis. Mild atherosclerotic changes of the carotid bifurcations without  evidence of stenosis (50% or greater) or occlusion MRI  Brain: Multiple scattered small acute infarctions within both cerebellar hemispheres. Larger confluent infarction affecting the left occipital lobe measuring up to 3 cm in size. No evidence of hemorrhage or mass effect. 2D Echo EF 60 to 65% without evidence of embolism LDL 81 HgbA1c 6.1 VTE prophylaxis - Lovenox 40mg  daily  No antiplatelet or anticoagulant prior to admission, now on ASA 81 mg and Plavix 75mg  x 3 months then ASA alone  Therapy recommendations:  Cleared for home Disposition:  Cleared for home       ROS:   14 system review of systems performed and negative with exception of those listed in HPI  PMH:  Past Medical History:  Diagnosis Date   CVA (cerebral vascular accident) (HCC) 04/07/2020   Diabetes mellitus without complication (HCC)    Dyslipidemia    GERD (gastroesophageal reflux disease)    Hypertension     PSH:  Past Surgical History:  Procedure Laterality Date   BACK SURGERY      Social History:  Social History    Socioeconomic History   Marital status: Married    Spouse name: Not on file   Number of children: Not on file   Years of education: Not on file   Highest education level: Not on file  Occupational History   Occupation: retired  Tobacco Use   Smoking status: Former    Packs/day: 3.00    Years: 20.00    Total pack years: 60.00    Types: Cigarettes    Quit date: 1987    Years since quitting: 36.9   Smokeless tobacco: Never  Substance and Sexual Activity   Alcohol use: Yes    Comment: occ   Drug use: Never   Sexual activity: Not on file  Other Topics Concern   Not on file  Social History Narrative   Not on file   Social Determinants of Health   Financial Resource Strain: Not on file  Food Insecurity: Not on file  Transportation Needs: Not on file  Physical Activity: Not on file  Stress: Not on file  Social  Connections: Not on file  Intimate Partner Violence: Not on file    Family History:  Family History  Problem Relation Age of Onset   Stroke Father    Stroke Sister    Stroke Brother        x 3 brothers    Medications:   Current Outpatient Medications on File Prior to Visit  Medication Sig Dispense Refill   amLODipine (NORVASC) 10 MG tablet Take 1 tablet (10 mg total) by mouth daily. (Patient taking differently: Take 5 mg by mouth daily. 1/2 tab daily)     atorvastatin (LIPITOR) 80 MG tablet TAKE 1 TABLET BY MOUTH EVERY DAY 90 tablet 2   carvedilol (COREG) 6.25 MG tablet Take 1 tablet (6.25 mg total) by mouth 2 (two) times daily.     clopidogrel (PLAVIX) 75 MG tablet Take 1 tablet (75 mg total) by mouth daily. 60 tablet 0   irbesartan (AVAPRO) 300 MG tablet Take 1 tablet (300 mg total) by mouth daily.     metFORMIN (GLUCOPHAGE) 1000 MG tablet Take 500 mg by mouth 2 (two) times daily. 1/2 tab daily     pantoprazole (PROTONIX) 20 MG tablet Take 20 mg by mouth daily.     No current facility-administered medications on file prior to visit.    Allergies:  No  Known Allergies    OBJECTIVE:  Physical Exam  Vitals:   01/07/22 1047  BP: 121/78  Pulse: 76  Weight: 188 lb 12.8 oz (85.6 kg)  Height: 5\' 10"  (1.778 m)   Body mass index is 27.09 kg/m. No results found.  General: well developed, well nourished, very pleasant elderly Caucasian male, seated, in no evident distress Head: head normocephalic and atraumatic.   Neck: supple with no carotid or supraclavicular bruits Cardiovascular: regular rate and rhythm, no murmurs Musculoskeletal: no deformity Skin:  no rash/petichiae Vascular:  Normal pulses all extremities   Neurologic Exam Mental Status: Awake and fully alert.   Fluent speech and language.  Oriented to place and time. Recent and remote memory intact. Attention span, concentration and fund of knowledge appropriate. Mood and affect appropriate.  Cranial Nerves: Pupils equal, briskly reactive to light. Extraocular movements full without nystagmus. Visual fields full to confrontation although subjective OD lower peripheral small area of visual loss. Hearing intact. Facial sensation intact. Face, tongue, palate moves normally and symmetrically.  Motor: Normal bulk and tone. Normal strength in all tested extremity muscles Sensory.: intact to touch , pinprick , position and vibratory sensation.  Coordination: Rapid alternating movements normal in all extremities. Finger-to-nose and heel-to-shin performed accurately bilaterally. Gait and Station: Arises from chair without difficulty. Stance is normal. Gait demonstrates normal stride length and mild balance without use of assistive device. Tandem walk and heel toe with mild to moderate difficulty.   Reflexes: 1+ and symmetric. Toes downgoing.        ASSESSMENT: Christopher Kirk is a 71 y.o. 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 Vascular risk factors  include diffuse intracranial arthrosclerosis, HTN, HLD, DM, advanced age and former tobacco use. Eval by Dr. 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 and component of analgesic rebound with great improvement after starting topiramate     PLAN:  B/l cerebellar and L occipital strokes:  Continue clopidogrel 75 mg daily and atorvastatin 80 mg daily for secondary stroke prevention measures - ongoing refills  per PCP  Discussed secondary stroke prevention measures and importance of close PCP follow up for aggressive stroke risk factor management including HTN with BP goal<130/90, HLD with LDL goal<70 and DM with A1c goal<7  mixed chronic headaches: will decrease topiramate to 25 mg nightly although lower suspicion this specific medication is causing his diarrhea.  Advised if headaches worsen with decreased dosage and no improvement of diarrhea to increase back to 50mg  nightly. If diarrhea improves but headaches worsen, will consider alternative medication. Discussed use of abortive medication but declines interest at this time    Follow up in 8 months or call earlier if needed    CC:  PCP: Avva, Ravisankar, MD    I spent 34 minutes of face-to-face and non-face-to-face time with patient and wife.  This included previsit chart review, lab review, study review, order entry, electronic health record documentation, patient and wife education and discussion regarding above diagnoses and treatment plan and answered all the questions to patient and wife's satisfaction  , AGNP-BC  [facility] [address] [place], [place]  Phone [phone] Fax [phone] Note: This document was prepared with digital dictation and possible smart phrase technology. Any transcriptional errors that result from this process are unintentional.

## 2022-01-07 ENCOUNTER — Ambulatory Visit: Payer: Medicare HMO | Admitting: Adult Health

## 2022-01-07 ENCOUNTER — Encounter: Payer: Self-pay | Admitting: Adult Health

## 2022-01-07 VITALS — BP 121/78 | HR 76 | Ht 70.0 in | Wt 188.8 lb

## 2022-01-07 DIAGNOSIS — G4489 Other headache syndrome: Secondary | ICD-10-CM | POA: Diagnosis not present

## 2022-01-07 DIAGNOSIS — I639 Cerebral infarction, unspecified: Secondary | ICD-10-CM | POA: Diagnosis not present

## 2022-01-07 MED ORDER — TOPIRAMATE 25 MG PO TABS: 25.0000 mg | ORAL_TABLET | Freq: Every day | ORAL | 5 refills | Status: DC

## 2022-01-07 NOTE — Patient Instructions (Addendum)
Decrease topamax to 25mg  nightly for headache prevention. If headaches worsen, please increase dosage back to 50mg  nightly. If symptoms improve with decreasing dose but headaches worsen, please let me know and we can discuss other treatment options.   Continue clopidogrel 75 mg daily  and atorvastatin for secondary stroke prevention  Continue to follow up with PCP regarding blood pressure and cholesterol management  Maintain strict control of hypertension with blood pressure goal below 130/90 and cholesterol with LDL cholesterol (bad cholesterol) goal below 70 mg/dL.   Signs of a Stroke? Follow the BEFAST method:  Balance Watch for a sudden loss of balance, trouble with coordination or vertigo Eyes Is there a sudden loss of vision in one or both eyes? Or double vision?  Face: Ask the person to smile. Does one side of the face droop or is it numb?  Arms: Ask the person to raise both arms. Does one arm drift downward? Is there weakness or numbness of a leg? Speech: Ask the person to repeat a simple phrase. Does the speech sound slurred/strange? Is the person confused ? Time: If you observe any of these signs, call 911.     Followup in the future with me in 8 months or call earlier if needed       Thank you for coming to see at [facility]. I hope we have been able to provide you high quality care today.  You may receive a patient satisfaction survey over the next few weeks. We would appreciate your feedback and comments so that we may continue to improve ourselves and the health of our patients.

## 2022-03-13 DIAGNOSIS — E041 Nontoxic single thyroid nodule: Secondary | ICD-10-CM | POA: Diagnosis not present

## 2022-03-13 DIAGNOSIS — E785 Hyperlipidemia, unspecified: Secondary | ICD-10-CM | POA: Diagnosis not present

## 2022-03-13 DIAGNOSIS — I10 Essential (primary) hypertension: Secondary | ICD-10-CM | POA: Diagnosis not present

## 2022-03-13 DIAGNOSIS — Z125 Encounter for screening for malignant neoplasm of prostate: Secondary | ICD-10-CM | POA: Diagnosis not present

## 2022-03-13 DIAGNOSIS — R7989 Other specified abnormal findings of blood chemistry: Secondary | ICD-10-CM | POA: Diagnosis not present

## 2022-03-13 DIAGNOSIS — E119 Type 2 diabetes mellitus without complications: Secondary | ICD-10-CM | POA: Diagnosis not present

## 2022-03-20 DIAGNOSIS — E041 Nontoxic single thyroid nodule: Secondary | ICD-10-CM | POA: Diagnosis not present

## 2022-03-20 DIAGNOSIS — K279 Peptic ulcer, site unspecified, unspecified as acute or chronic, without hemorrhage or perforation: Secondary | ICD-10-CM | POA: Diagnosis not present

## 2022-03-20 DIAGNOSIS — E118 Type 2 diabetes mellitus with unspecified complications: Secondary | ICD-10-CM | POA: Diagnosis not present

## 2022-03-20 DIAGNOSIS — R82998 Other abnormal findings in urine: Secondary | ICD-10-CM | POA: Diagnosis not present

## 2022-03-20 DIAGNOSIS — R634 Abnormal weight loss: Secondary | ICD-10-CM | POA: Diagnosis not present

## 2022-03-20 DIAGNOSIS — I63211 Cerebral infarction due to unspecified occlusion or stenosis of right vertebral arteries: Secondary | ICD-10-CM | POA: Diagnosis not present

## 2022-03-20 DIAGNOSIS — I10 Essential (primary) hypertension: Secondary | ICD-10-CM | POA: Diagnosis not present

## 2022-03-20 DIAGNOSIS — Z0000 Encounter for general adult medical examination without abnormal findings: Secondary | ICD-10-CM | POA: Diagnosis not present

## 2022-03-20 DIAGNOSIS — E785 Hyperlipidemia, unspecified: Secondary | ICD-10-CM | POA: Diagnosis not present

## 2022-03-20 DIAGNOSIS — R519 Headache, unspecified: Secondary | ICD-10-CM | POA: Diagnosis not present

## 2022-03-20 DIAGNOSIS — M5380 Other specified dorsopathies, site unspecified: Secondary | ICD-10-CM | POA: Diagnosis not present

## 2022-04-25 ENCOUNTER — Other Ambulatory Visit: Payer: Self-pay | Admitting: Adult Health

## 2022-05-23 DIAGNOSIS — I10 Essential (primary) hypertension: Secondary | ICD-10-CM | POA: Diagnosis not present

## 2022-05-23 DIAGNOSIS — I63211 Cerebral infarction due to unspecified occlusion or stenosis of right vertebral arteries: Secondary | ICD-10-CM | POA: Diagnosis not present

## 2022-05-23 DIAGNOSIS — E118 Type 2 diabetes mellitus with unspecified complications: Secondary | ICD-10-CM | POA: Diagnosis not present

## 2022-05-23 DIAGNOSIS — R634 Abnormal weight loss: Secondary | ICD-10-CM | POA: Diagnosis not present

## 2022-07-22 DIAGNOSIS — E041 Nontoxic single thyroid nodule: Secondary | ICD-10-CM | POA: Diagnosis not present

## 2022-07-22 DIAGNOSIS — E785 Hyperlipidemia, unspecified: Secondary | ICD-10-CM | POA: Diagnosis not present

## 2022-07-22 DIAGNOSIS — R634 Abnormal weight loss: Secondary | ICD-10-CM | POA: Diagnosis not present

## 2022-07-22 DIAGNOSIS — Z8673 Personal history of transient ischemic attack (TIA), and cerebral infarction without residual deficits: Secondary | ICD-10-CM | POA: Diagnosis not present

## 2022-07-22 DIAGNOSIS — I10 Essential (primary) hypertension: Secondary | ICD-10-CM | POA: Diagnosis not present

## 2022-07-22 DIAGNOSIS — E118 Type 2 diabetes mellitus with unspecified complications: Secondary | ICD-10-CM | POA: Diagnosis not present

## 2022-07-22 DIAGNOSIS — K279 Peptic ulcer, site unspecified, unspecified as acute or chronic, without hemorrhage or perforation: Secondary | ICD-10-CM | POA: Diagnosis not present

## 2022-07-22 DIAGNOSIS — M5380 Other specified dorsopathies, site unspecified: Secondary | ICD-10-CM | POA: Diagnosis not present

## 2022-07-22 DIAGNOSIS — R2681 Unsteadiness on feet: Secondary | ICD-10-CM | POA: Diagnosis not present

## 2022-07-22 DIAGNOSIS — R519 Headache, unspecified: Secondary | ICD-10-CM | POA: Diagnosis not present

## 2022-09-09 NOTE — Progress Notes (Unsigned)
[facility] [address] [place]. [place] [address] [phone]       STROKE FOLLOW UP NOTE  [name] Date of Birth:  1950/07/24 Medical Record Number:  [id number]   Reason for Referral: stroke follow up    SUBJECTIVE:   CHIEF COMPLAINT:  No chief complaint on file.    HPI:   Update 09/10/2022 JM: Patient returns for follow-up visit.  Has been stable since prior visit.  Remains on topiramate 25 mg nightly ***.   Stable from stroke standpoint, gait impairment stable.  Remains on Plavix and atorvastatin.  Routinely follows with PCP for stroke risk factor management.       History provided for reference purposes only Update 01/07/2022 JM: Returns for 20-month follow-up accompanied by his wife.  Overall stable.  No new stroke/TIA symptoms.  Has remained on topamax 50mg  nightly, about 3-4 headaches per month. Usually will awaken with them, will eventually resolve on their own, can last all day at times. Not debilitating.  Continued gait impairment, stable. Wife mentions issues with diarrhea, patient believes possibly from one of his evening medications, at times will forget to take evening meds and diarrhea will be better the following day. PCP did recently adjust metformin dosage but denies any improvement of diarrhea.   Remains on Plavix and atorvastatin Blood pressure well-controlled Routinely follows with PCP Dr. Felipa Kirk, recent visit, has f/u in February again for annual physical    Update 07/04/2021 JM: Patient returns for 52-month stroke follow-up accompanied by his wife.  Overall doing well since prior visit.  Denies new stroke/TIA symptoms.  Headaches have been stable, has mild headaches only occasionally with ongoing use of topiramate 50 mg twice daily.  He tries to stay active during the day but can still tire easily, he is better about taking breaks when needed. Continues gait impairment but stable since prior visit, denies any recent falls.   Continued right lower vision impairment, believes this has improved some, routinely follows with ophthalmology.  Denies new stroke/TIA symptoms.  Compliant on Plavix and atorvastatin, denies side effects.  Blood pressure today 124/77.  PCP has been making medication changes to blood pressure regimen as well as diabetic regimen. Completed lab work in January with PCP - unable to view via epic. Is scheduled Friday with PCP for follow up.  No new concerns at this time.   Update 01/01/2021 JM: Returns for follow-up after prior visit with Dr. Pearlean Kirk for new daily persistent headaches 3 months ago and prior stroke history.  He is accompanied today by his wife.  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 Reports great improvement of headaches currently experiencing 1 to 2/month only lasting a few hours after taking Tylenol.  He denies any continued analgesic overuse.  He has tolerated topiramate without side effects.  Stable from stroke standpoint without new stroke/TIA symptoms Continued imbalance but per patient, currently at baseline with chronic gait impairment/imbalance due to chronic lumbar issues. Continued OD lower outer edge visual impairment which has been stable.  Routinely followed by ophthalmology.  Does not interfere with daily functioning or activity. Reports continued day time fatigue (not worsened since starting Topamax).  Initially reported he sleeps well at night but then reported he will have to wake up at times in the middle the night to let the dogs out or go to the bathroom. He does not feel rested upon  awakening in the morning.  Occasionally awakens with headache.  Has not previously underwent sleep study.  Compliant on Plavix and atorvastatin -denies side effects Blood pressure today 114/71 - occasionally monitors at home which has been stable  No new concerns at this  time  Update 10/03/2020 Dr. Pearlean Kirk: Christopher Kirk is a 72 year old Caucasian male seen today for office consultation visit for headaches and dizziness.  Is accompanied by his wife.  History is obtained from them and review of electronic medical records and personally reviewed pertinent available imaging films in PACS.Christopher Kirk is a 72 y.o. 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  Patient has had residual right-sided peripheral visual difficulties as well as gait and balance issues.  He is referred back to see me today for mostly because of concerns about headache.  Patient states that for the last 2 -3 weeks he has had a daily headache.  Headache starts mostly in the back and occasionally goes to the front.  It varies in severity from 3/10-8/10.  At times it is dull in times it is quite throbbing and severe.  There is some light sensitivity and sound sensitivity.  He does have prior history of what sound like migraine headaches most of his life but they have been infrequent.  Usually finds relief with Aleve or migraine Excedrin.  He has been taking 1 or 2 of these tablets daily for the last 2 weeks with headaches keep on coming back.  The headaches are tolerable if he takes his pills and is able to work but they are not going away.  He had an MRI scan of the brain done on 09/28/2020 which shows no acute abnormalities.  Old left occipital and basal ganglia infarcts are noted.  MR angiogram of the brain shows diminutive basilar artery with possible mid basilar occlusion and left posterior cerebral artery also is occluded and right posterior cerebral artery has moderate  stenosis at P1 P2 junction.  He had lab work done on 06/22/2020 which showed optimal LDL cholesterol at 59 mg percent and hemoglobin A1c at 5.2.  He was on aspirin and Plavix for 3 months following his stroke in March and subsequently on aspirin alone but was recently put back on Plavix by his primary physician few weeks ago.  Patient states he is recovered well from his stroke has some mild residual balance difficulties which seem to be more pronounced when he is fatigued.  He has some slight peripheral vision difficulty in the right eye as well.  His blood pressure is well controlled and today is 125/84.  He is tolerating his Lipitor well without any side effects.    Initial visit 05/10/2020 JM: Christopher Kirk is being seen for hospital follow-up accompanied by his wife, Gavin Pound  Reports residual imbalance, visual impairment and fatigue with some improvement and has been slowly returning back to prior activities.  He does tire quicker and less activity tolerance since his stroke.  Evaluated by his ophthalmologist and per patient, has small area of visual loss in just his right eye in the bottom outer edge but has been slowly improving.  Compliant on aspirin and Plavix without associated side effects - he does need refill on plavix He is currently on  pravastatin as he was only provided 30 days of atorvastatin -he was tolerating atorvastatin without side effects Blood pressure today 119/75   No further concerns at this time  Stroke admission 11/07/2020 Christopher Kirk is a 72 yo male with PMH of DM, HTN, HLD who presented on 11/07/2020 after transient episode of left sided weakness x 2 and episode of "wonky vision" and R sided headache with resolution of his neuro symptoms but persistent right sided headache.   Personally reviewed hospitalization pertinent progress notes, lab work and imaging with summary provided.  Evaluated by Dr. Pearlean Kirk with stroke work-up revealing scattered embolic appearing posterior  circulation strokes with multifocal multivessel intracranial stenosis, most notably distal right vertebral artery occlusion, diminutive basilar artery and bilateral posterior cerebral artery atherosclerosis.  Recommended DAPT for 3 months and aspirin alone.  HTN stable.  LDL 81 and switch pravastatin to atorvastatin 80 mg daily.  Other stroke risk factors include advanced age and former tobacco use but no prior stroke history.  Other active problems include multinodular thyroid gland noted on imaging and recommended follow-up outpatient.  Evaluated by therapies and discharged home in stable condition without therapy needs  Scattered embolic appearing posterior circulation strokes with multifocal multivessel intracranial stenosis, most notably distal right vertebral artery occlusion, diminutive basilar artery and bilateral posterior cerebral artery atherosclerosis. Code Stroke note done, MRI obtained immediately instead.  CTA head & neck: multivessel intracranial stenosis. Mild atherosclerotic changes of the carotid bifurcations without  evidence of stenosis (50% or greater) or occlusion MRI  Brain: Multiple scattered small acute infarctions within both cerebellar hemispheres. Larger confluent infarction affecting the left occipital lobe measuring up to 3 cm in size. No evidence of hemorrhage or mass effect. 2D Echo EF 60 to 65% without evidence of embolism LDL 81 HgbA1c 6.1 VTE prophylaxis - Lovenox 40mg  daily  No antiplatelet or anticoagulant prior to admission, now on ASA 81 mg and Plavix 75mg  x 3 months then ASA alone  Therapy recommendations:  Cleared for home Disposition:  Cleared for home       ROS:   14 system review of systems performed and negative with exception of those listed in HPI  PMH:  Past Medical History:  Diagnosis Date   CVA (cerebral vascular accident) (HCC) 04/07/2020   Diabetes mellitus without complication (HCC)    Dyslipidemia    GERD (gastroesophageal reflux  disease)    Hypertension     PSH:  Past Surgical History:  Procedure Laterality Date   BACK SURGERY      Social History:  Social History   Socioeconomic History   Marital status: Married    Spouse name: Not on file   Number of children: Not on file   Years of education: Not on file   Highest education level: Not on file  Occupational History   Occupation: retired  Tobacco Use   Smoking status: Former    Current packs/day: 0.00    Average packs/day: 3.0 packs/day for 20.0 years (60.0 ttl pk-yrs)    Types: Cigarettes    Start date: 57    Quit date: 1987    Years since quitting: 37.6   Smokeless tobacco: Never  Substance and Sexual Activity   Alcohol use: Yes    Comment: occ   Drug use: Never   Sexual activity: Not on file  Other Topics Concern   Not on file  Social History Narrative   Not on file   Social Determinants of Health   Financial Resource Strain:  Not on file  Food Insecurity: Not on file  Transportation Needs: Not on file  Physical Activity: Not on file  Stress: Not on file  Social Connections: Not on file  Intimate Partner Violence: Not on file    Family History:  Family History  Problem Relation Age of Onset   Stroke Father    Stroke Sister    Stroke Brother        x 3 brothers    Medications:   Current Outpatient Medications on File Prior to Visit  Medication Sig Dispense Refill   amLODipine (NORVASC) 10 MG tablet Take 1 tablet (10 mg total) by mouth daily. (Patient taking differently: Take 5 mg by mouth daily. 1/2 tab daily)     atorvastatin (LIPITOR) 80 MG tablet TAKE 1 TABLET BY MOUTH EVERY DAY 90 tablet 2   carvedilol (COREG) 6.25 MG tablet Take 1 tablet (6.25 mg total) by mouth 2 (two) times daily.     clopidogrel (PLAVIX) 75 MG tablet Take 1 tablet (75 mg total) by mouth daily. 60 tablet 0   irbesartan (AVAPRO) 300 MG tablet Take 1 tablet (300 mg total) by mouth daily.     metFORMIN (GLUCOPHAGE) 1000 MG tablet Take 500 mg by mouth  2 (two) times daily. 1/2 tab daily     pantoprazole (PROTONIX) 20 MG tablet Take 20 mg by mouth daily.     topiramate (TOPAMAX) 25 MG tablet Take 1 tablet (25 mg total) by mouth at bedtime. 30 tablet 5   No current facility-administered medications on file prior to visit.    Allergies:  No Known Allergies    OBJECTIVE:  Physical Exam  There were no vitals filed for this visit.  There is no height or weight on file to calculate BMI. No results found.  General: well developed, well nourished, very pleasant elderly Caucasian male, seated, in no evident distress Head: head normocephalic and atraumatic.   Neck: supple with no carotid or supraclavicular bruits Cardiovascular: regular rate and rhythm, no murmurs Musculoskeletal: no deformity Skin:  no rash/petichiae Vascular:  Normal pulses all extremities   Neurologic Exam Mental Status: Awake and fully alert.   Fluent speech and language.  Oriented to place and time. Recent and remote memory intact. Attention span, concentration and fund of knowledge appropriate. Mood and affect appropriate.  Cranial Nerves: Pupils equal, briskly reactive to light. Extraocular movements full without nystagmus. Visual fields full to confrontation although subjective OD lower peripheral small area of visual loss. Hearing intact. Facial sensation intact. Face, tongue, palate moves normally and symmetrically.  Motor: Normal bulk and tone. Normal strength in all tested extremity muscles Sensory.: intact to touch , pinprick , position and vibratory sensation.  Coordination: Rapid alternating movements normal in all extremities. Finger-to-nose and heel-to-shin performed accurately bilaterally. Gait and Station: Arises from chair without difficulty. Stance is normal. Gait demonstrates normal stride length and mild balance without use of assistive device. Tandem walk and heel toe with mild to moderate difficulty.   Reflexes: 1+ and symmetric. Toes downgoing.         ASSESSMENT: Christopher Kirk is a 72 y.o. 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 Vascular risk factors include diffuse intracranial arthrosclerosis, HTN, HLD, DM, advanced age and former tobacco use. Eval by Dr. 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 and component of analgesic rebound with great  improvement after starting topiramate     PLAN:  B/l cerebellar and L occipital strokes:  Continue clopidogrel 75 mg daily and atorvastatin 80 mg daily for secondary stroke prevention measures - ongoing refills per PCP  Discussed secondary stroke prevention measures and importance of close PCP follow up for aggressive stroke risk factor management including HTN with BP goal<130/90, HLD with LDL goal<70 and DM with A1c goal<7  mixed chronic headaches: will decrease topiramate to 25 mg nightly although lower suspicion this specific medication is causing his diarrhea.  Advised if headaches worsen with decreased dosage and no improvement of diarrhea to increase back to 50mg  nightly. If diarrhea improves but headaches worsen, will consider alternative medication. Discussed use of abortive medication but declines interest at this time    Follow up in 8 months or call earlier if needed    CC:  PCP: Avva, Ravisankar, MD    I spent 34 minutes of face-to-face and non-face-to-face time with patient and wife.  This included previsit chart review, lab review, study review, order entry, electronic health record documentation, patient and wife education and discussion regarding above diagnoses and treatment plan and answered all the questions to patient and wife's satisfaction  Ihor Austin, AGNP-BC  [facility] [address] [place], [place] [address]  Phone  [phone] Fax [phone] Note: This document was prepared with digital dictation and possible smart phrase technology. Any transcriptional errors that result from this process are unintentional.

## 2022-09-10 ENCOUNTER — Ambulatory Visit: Payer: Medicare HMO | Admitting: Adult Health

## 2022-09-10 ENCOUNTER — Encounter: Payer: Self-pay | Admitting: Adult Health

## 2022-09-10 VITALS — BP 110/66 | HR 68 | Ht 70.0 in | Wt 182.0 lb

## 2022-09-10 DIAGNOSIS — G4489 Other headache syndrome: Secondary | ICD-10-CM

## 2022-09-10 DIAGNOSIS — I639 Cerebral infarction, unspecified: Secondary | ICD-10-CM

## 2022-09-10 NOTE — Patient Instructions (Addendum)
Can try to stop topamax as you have no had any bad headaches recently - if headaches return after stopping, please restart and let me know   Continue clopidogrel 75 mg daily  and atorvastatin  for secondary stroke prevention  Continue to follow up with PCP regarding cholesterol and blood pressure management  Maintain strict control of hypertension with blood pressure goal below 130/90 and cholesterol with LDL cholesterol (bad cholesterol) goal below 70 mg/dL.   Signs of a Stroke? Follow the BEFAST method:  Balance Watch for a sudden loss of balance, trouble with coordination or vertigo Eyes Is there a sudden loss of vision in one or both eyes? Or double vision?  Face: Ask the person to smile. Does one side of the face droop or is it numb?  Arms: Ask the person to raise both arms. Does one arm drift downward? Is there weakness or numbness of a leg? Speech: Ask the person to repeat a simple phrase. Does the speech sound slurred/strange? Is the person confused ? Time: If you observe any of these signs, call 911.    Can plan to follow up as needed at this time but if you need to restart topamax for headaches, please schedule a 6 month follow up visit     Thank you for coming to see [place] at [facility]. I hope we have been able to provide you high quality care today.  You may receive a patient satisfaction survey over the next few weeks. We would appreciate your feedback and comments so that we may continue to improve ourselves and the health of our patients.

## 2022-11-07 ENCOUNTER — Other Ambulatory Visit: Payer: Self-pay | Admitting: Adult Health

## 2022-11-28 DIAGNOSIS — R519 Headache, unspecified: Secondary | ICD-10-CM | POA: Diagnosis not present

## 2022-11-28 DIAGNOSIS — R2681 Unsteadiness on feet: Secondary | ICD-10-CM | POA: Diagnosis not present

## 2022-11-28 DIAGNOSIS — I63211 Cerebral infarction due to unspecified occlusion or stenosis of right vertebral arteries: Secondary | ICD-10-CM | POA: Diagnosis not present

## 2022-11-28 DIAGNOSIS — M5380 Other specified dorsopathies, site unspecified: Secondary | ICD-10-CM | POA: Diagnosis not present

## 2022-11-28 DIAGNOSIS — I10 Essential (primary) hypertension: Secondary | ICD-10-CM | POA: Diagnosis not present

## 2022-11-28 DIAGNOSIS — E118 Type 2 diabetes mellitus with unspecified complications: Secondary | ICD-10-CM | POA: Diagnosis not present

## 2022-11-28 DIAGNOSIS — E785 Hyperlipidemia, unspecified: Secondary | ICD-10-CM | POA: Diagnosis not present

## 2022-11-28 DIAGNOSIS — R634 Abnormal weight loss: Secondary | ICD-10-CM | POA: Diagnosis not present

## 2022-11-28 DIAGNOSIS — K279 Peptic ulcer, site unspecified, unspecified as acute or chronic, without hemorrhage or perforation: Secondary | ICD-10-CM | POA: Diagnosis not present

## 2022-11-28 DIAGNOSIS — Z23 Encounter for immunization: Secondary | ICD-10-CM | POA: Diagnosis not present

## 2022-11-28 DIAGNOSIS — E041 Nontoxic single thyroid nodule: Secondary | ICD-10-CM | POA: Diagnosis not present

## 2022-12-28 ENCOUNTER — Other Ambulatory Visit: Payer: Self-pay | Admitting: Adult Health

## 2022-12-31 NOTE — Telephone Encounter (Signed)
Please see note from recent visit.  He was on topiramate 25 mg nightly and discussed discontinuing.  Please contact patient to see if he was able to stop this medication or if he has continued on 25 mg nightly dosing.  Topiramate 50 mg twice daily dosing is an old prescription.

## 2023-01-01 NOTE — Telephone Encounter (Signed)
Please contact patient as previously requested to see if he is still taking topiramate as he was previously on 25 mg nightly at most recent visit but discussed discontinuing. If he was unable to stop (or needed to restart) and needs refill, can provide topiramate refill with 25 mg dosing to take nightly. Will refuse current prescription request of 50 mg twice daily dosing as this is an old prescription. Thank you.

## 2023-01-01 NOTE — Telephone Encounter (Signed)
Called patient to informed him per Lexine Baton, NP "He was on topiramate 25 mg nightly and discussed discontinuing.  Please contact patient to see if he was able to stop this medication or if he has continued on 25 mg nightly dosing.  Topiramate 50 mg twice daily dosing is an old prescription." Patient states he still taking 25 mg of his topiramate Rx at night. Pt verbalized understanding. Pt had no questions at this time but was encouraged to call back if questions arise.

## 2023-01-06 DIAGNOSIS — I10 Essential (primary) hypertension: Secondary | ICD-10-CM | POA: Diagnosis not present

## 2023-01-06 DIAGNOSIS — E118 Type 2 diabetes mellitus with unspecified complications: Secondary | ICD-10-CM | POA: Diagnosis not present

## 2023-01-06 DIAGNOSIS — E785 Hyperlipidemia, unspecified: Secondary | ICD-10-CM | POA: Diagnosis not present

## 2023-01-28 ENCOUNTER — Other Ambulatory Visit: Payer: Self-pay | Admitting: Adult Health

## 2023-01-28 NOTE — Telephone Encounter (Signed)
Rx refilled per last office visit note.

## 2023-04-09 DIAGNOSIS — E041 Nontoxic single thyroid nodule: Secondary | ICD-10-CM | POA: Diagnosis not present

## 2023-04-09 DIAGNOSIS — Z125 Encounter for screening for malignant neoplasm of prostate: Secondary | ICD-10-CM | POA: Diagnosis not present

## 2023-04-09 DIAGNOSIS — I10 Essential (primary) hypertension: Secondary | ICD-10-CM | POA: Diagnosis not present

## 2023-04-09 DIAGNOSIS — E785 Hyperlipidemia, unspecified: Secondary | ICD-10-CM | POA: Diagnosis not present

## 2023-04-09 DIAGNOSIS — Z1212 Encounter for screening for malignant neoplasm of rectum: Secondary | ICD-10-CM | POA: Diagnosis not present

## 2023-04-09 DIAGNOSIS — E119 Type 2 diabetes mellitus without complications: Secondary | ICD-10-CM | POA: Diagnosis not present

## 2023-04-11 ENCOUNTER — Emergency Department (HOSPITAL_COMMUNITY)

## 2023-04-11 ENCOUNTER — Other Ambulatory Visit: Payer: Self-pay

## 2023-04-11 ENCOUNTER — Encounter (HOSPITAL_COMMUNITY): Payer: Self-pay | Admitting: *Deleted

## 2023-04-11 ENCOUNTER — Emergency Department (HOSPITAL_COMMUNITY)
Admission: EM | Admit: 2023-04-11 | Discharge: 2023-04-12 | Disposition: A | Discharge status: Home / Self Care | Attending: Emergency Medicine | Admitting: Emergency Medicine

## 2023-04-11 DIAGNOSIS — R0789 Other chest pain: Secondary | ICD-10-CM | POA: Diagnosis not present

## 2023-04-11 DIAGNOSIS — Z79899 Other long term (current) drug therapy: Secondary | ICD-10-CM | POA: Diagnosis not present

## 2023-04-11 DIAGNOSIS — R0602 Shortness of breath: Secondary | ICD-10-CM | POA: Insufficient documentation

## 2023-04-11 DIAGNOSIS — I10 Essential (primary) hypertension: Secondary | ICD-10-CM | POA: Diagnosis not present

## 2023-04-11 DIAGNOSIS — R079 Chest pain, unspecified: Secondary | ICD-10-CM | POA: Diagnosis not present

## 2023-04-11 DIAGNOSIS — D72829 Elevated white blood cell count, unspecified: Secondary | ICD-10-CM | POA: Insufficient documentation

## 2023-04-11 DIAGNOSIS — R0989 Other specified symptoms and signs involving the circulatory and respiratory systems: Secondary | ICD-10-CM | POA: Diagnosis not present

## 2023-04-11 DIAGNOSIS — Z7902 Long term (current) use of antithrombotics/antiplatelets: Secondary | ICD-10-CM | POA: Diagnosis not present

## 2023-04-11 DIAGNOSIS — Z7984 Long term (current) use of oral hypoglycemic drugs: Secondary | ICD-10-CM | POA: Diagnosis not present

## 2023-04-11 DIAGNOSIS — J9811 Atelectasis: Secondary | ICD-10-CM | POA: Diagnosis not present

## 2023-04-11 DIAGNOSIS — I2510 Atherosclerotic heart disease of native coronary artery without angina pectoris: Secondary | ICD-10-CM | POA: Diagnosis not present

## 2023-04-11 DIAGNOSIS — Z8673 Personal history of transient ischemic attack (TIA), and cerebral infarction without residual deficits: Secondary | ICD-10-CM | POA: Insufficient documentation

## 2023-04-11 DIAGNOSIS — E119 Type 2 diabetes mellitus without complications: Secondary | ICD-10-CM | POA: Insufficient documentation

## 2023-04-11 DIAGNOSIS — R7989 Other specified abnormal findings of blood chemistry: Secondary | ICD-10-CM | POA: Diagnosis not present

## 2023-04-11 DIAGNOSIS — E049 Nontoxic goiter, unspecified: Secondary | ICD-10-CM | POA: Diagnosis not present

## 2023-04-11 LAB — BASIC METABOLIC PANEL
Anion gap: 12 (ref 5–15)
BUN: 14 mg/dL (ref 8–23)
CO2: 23 mmol/L (ref 22–32)
Calcium: 9 mg/dL (ref 8.9–10.3)
Chloride: 100 mmol/L (ref 98–111)
Creatinine, Ser: 0.74 mg/dL (ref 0.61–1.24)
GFR, Estimated: >60 mL/min (ref >60)
Glucose, Bld: 211 mg/dL — ABNORMAL HIGH (ref 70–99)
Potassium: 3.6 mmol/L (ref 3.5–5.1)
Sodium: 135 mmol/L (ref 135–145)

## 2023-04-11 LAB — RESP PANEL BY RT-PCR (RSV, FLU A&B, COVID)  RVPGX2
Influenza A by PCR: NEGATIVE
Influenza B by PCR: NEGATIVE
Resp Syncytial Virus by PCR: NEGATIVE
SARS Coronavirus 2 by RT PCR: NEGATIVE

## 2023-04-11 LAB — CBC
HCT: 44.5 % (ref 39.0–52.0)
Hemoglobin: 15.1 g/dL (ref 13.0–17.0)
MCH: 30.7 pg (ref 26.0–34.0)
MCHC: 33.9 g/dL (ref 30.0–36.0)
MCV: 90.4 fL (ref 80.0–100.0)
Platelets: 256 10*3/uL (ref 150–400)
RBC: 4.92 MIL/uL (ref 4.22–5.81)
RDW: 12.6 % (ref 11.5–15.5)
WBC: 14.7 10*3/uL — ABNORMAL HIGH (ref 4.0–10.5)
nRBC: 0 % (ref 0.0–0.2)

## 2023-04-11 LAB — TROPONIN I (HIGH SENSITIVITY)
Troponin I (High Sensitivity): 4 ng/L (ref <18)
Troponin I (High Sensitivity): 4 ng/L (ref <18)

## 2023-04-11 MED ORDER — IOHEXOL 350 MG/ML SOLN
75.0000 mL | Freq: Once | INTRAVENOUS | Status: AC | PRN
  Administered 2023-04-11: 75 mL via INTRAVENOUS

## 2023-04-11 MED ORDER — FENTANYL CITRATE PF 50 MCG/ML IJ SOSY
50.0000 ug | PREFILLED_SYRINGE | Freq: Once | INTRAMUSCULAR | Status: AC
  Administered 2023-04-11: 50 ug via INTRAVENOUS
  Filled 2023-04-11: qty 1

## 2023-04-11 NOTE — ED Provider Notes (Signed)
 [place] EMERGENCY DEPARTMENT AT [facility] Provider Note   CSN: [id number] Arrival date & time: 04/11/23  1716     History  Chief Complaint  Patient presents with   Chest Pain    Christopher Kirk is a 73 y.o. male.  The history is provided by the patient and medical records.  Chest Pain  73 year old male with history of prior stroke, diabetes, dyslipidemia, GERD, hypertension, presenting to the ED with chest pain and shortness of breath.  Patient states that pain began yesterday evening, feels like a very sharp stabbing pain in the center of his chest.  Pain does seem worse with deep breathing, leaning forward, or trying to lay flat.  He does feel little short of breath.  Has had a mild cough but no fever or chills.  No hemoptysis.  Denies any prior history of MI or stenting.  Has had multiple prior strokes and is on Plavix.  Home Medications Prior to Admission medications   Medication Sig Start Date End Date Taking? Authorizing Provider  amLODipine (NORVASC) 10 MG tablet Take 1 tablet (10 mg total) by mouth daily. Patient taking differently: Take 5 mg by mouth daily. 1/2 tab daily 04/09/20   Zigmund Daniel., MD  atorvastatin (LIPITOR) 80 MG tablet TAKE 1 TABLET BY MOUTH EVERY DAY 01/28/23   Ihor Austin, NP  carvedilol (COREG) 6.25 MG tablet Take 1 tablet (6.25 mg total) by mouth 2 (two) times daily. 04/10/20   Zigmund Daniel., MD  clopidogrel (PLAVIX) 75 MG tablet Take 1 tablet (75 mg total) by mouth daily. 05/10/20   Ihor Austin, NP  irbesartan (AVAPRO) 300 MG tablet Take 1 tablet (300 mg total) by mouth daily. 04/10/20   Zigmund Daniel., MD  metFORMIN (GLUCOPHAGE) 1000 MG tablet Take 500 mg by mouth 2 (two) times daily. 1/2 tab daily 02/29/20   [provider]  pantoprazole (PROTONIX) 20 MG tablet Take 20 mg by mouth daily.    [provider]  topiramate (TOPAMAX) 25 MG tablet Take 1 tablet (25 mg total) by mouth at bedtime.  01/07/22   Ihor Austin, NP      Allergies    Patient has no known allergies.    Review of Systems   Review of Systems  Cardiovascular:  Positive for chest pain.  All other systems reviewed and are negative.   Physical Exam Updated Vital Signs BP (!) 144/84 (BP Location: Right Arm)   Pulse 78   Temp 97.9 F (36.6 C) (Oral)   Resp 20   Ht 5\' 10"  (1.778 m)   Wt 82.6 kg   SpO2 98%   BMI 26.13 kg/m  Physical Exam Vitals and nursing note reviewed.  Constitutional:      Appearance: He is well-developed.     Comments: Sitting slightly leant forward, one leg on the floor  HENT:     Head: Normocephalic and atraumatic.  Eyes:     General: No scleral icterus.       Right eye: No discharge.     Conjunctiva/sclera: Conjunctivae normal.     Pupils: Pupils are equal, round, and reactive to light.  Cardiovascular:     Rate and Rhythm: Normal rate and regular rhythm.     Heart sounds: Normal heart sounds.  Pulmonary:     Effort: Pulmonary effort is normal.     Breath sounds: Normal breath sounds.     Comments: Slightly shallow breaths intentionally due to pain with deep  breathing, lungs grossly clear Chest:     Comments: Mid-sternal area is slightly tender to palpation, there is no acute deformity or bruising, no overlying skin changes, no rash Abdominal:     General: Bowel sounds are normal.     Palpations: Abdomen is soft.  Musculoskeletal:        General: Normal range of motion.     Cervical back: Normal range of motion.     Comments: No significant ankle edema  Skin:    General: Skin is warm and dry.  Neurological:     Mental Status: He is alert and oriented to person, place, and time.     ED Results / Procedures / Treatments   Labs (all labs ordered are listed, but only abnormal results are displayed) Labs Reviewed  BASIC METABOLIC PANEL - Abnormal; Notable for the following components:      Result Value   Glucose, Bld 211 (*)    All other components within  normal limits  CBC - Abnormal; Notable for the following components:   WBC 14.7 (*)    All other components within normal limits  RESP PANEL BY RT-PCR (RSV, FLU A&B, COVID)  RVPGX2  TROPONIN I (HIGH SENSITIVITY)  TROPONIN I (HIGH SENSITIVITY)    EKG EKG Interpretation Date/Time:  Friday April 11 2023 17:33:45 EDT Ventricular Rate:  87 PR Interval:  166 QRS Duration:  84 QT Interval:  366 QTC Calculation: 440 R Axis:   25  Text Interpretation: Normal sinus rhythm Inferior infarct , age undetermined Abnormal ECG When compared with ECG of 07-Apr-2020 09:32, PREVIOUS ECG IS PRESENT similar to prior EKG, slightly more pronounced ST elevation laterally Confirmed by Rolan Bucco [phone]) on 04/11/2023 5:39:55 PM  Radiology DG Chest 2 View Result Date: 04/11/2023 CLINICAL DATA:  Chest pain EXAM: CHEST - 2 VIEW COMPARISON:  None Available. FINDINGS: Low lung volumes. Subsegmental atelectasis at the lingula and lung bases. Normal cardiac size. No pneumothorax IMPRESSION: Low lung volumes with subsegmental atelectasis at the lingula and lung bases. Electronically Signed   By: Jasmine Pang M.D.   On: 04/11/2023 20:08    Procedures Procedures    Medications Ordered in ED Medications  fentaNYL (SUBLIMAZE) injection 50 mcg (has no administration in time range)    ED Course/ Medical Decision Making/ A&P                                 Medical Decision Making Amount and/or Complexity of Data Reviewed Labs: ordered. Radiology: ordered and independent interpretation performed. ECG/medicine tests: ordered and independent interpretation performed.  Risk Prescription drug management.   73 year old male here with chest pain that began yesterday evening.  Pain worse with deep breathing or change in position.  He is afebrile and nontoxic in appearance here.  He is hemodynamically stable.  EKG today is nonischemic.  Labs are grossly reassuring.  Does have leukocytosis but no fever or other  infectious symptoms.  No electrolyte derangement.  Troponin negative x 2.  Chest x-ray without acute findings.  In light of his symptoms, CTA was obtained which does not reveal any evidence of PE.  No pericardial effusion or acute infiltrate.  He does have coronary atherosclerosis noted on CT.  His workup today is grossly reassuring from a cardiac standpoint.  Given he does have atherosclerosis noted on CT, will refer to cardiology for further evaluation, may benefit from echo or stress test.  Can follow-up  with PCP in the interim.  Return here for new concerns.  Final Clinical Impression(s) / ED Diagnoses Final diagnoses:  Chest pain in adult    Rx / DC Orders ED Discharge Orders          Ordered    Ambulatory referral to Cardiology        04/12/23 0036    traMADol (ULTRAM) 50 MG tablet  Every 6 hours PRN        04/12/23 0036              Garlon Hatchet, PA-C 04/12/23 0129    Lorre Nick, MD 04/14/23 1051

## 2023-04-11 NOTE — ED Triage Notes (Signed)
 Chest pain since last night sob and he had a headache hx stroke on a blood thinner

## 2023-04-11 NOTE — ED Notes (Signed)
 Patient transported to CT

## 2023-04-11 NOTE — ED Provider Triage Note (Signed)
 Emergency Medicine Provider Triage Evaluation Note  Christopher Kirk , a 73 y.o. male  was evaluated in triage.  Pt complains of chest pain and shortness of breath.  Reports this began last night has progressively worsened.  Endorsing lightheadedness, dizziness.  Denies leg swelling, nausea, vomiting or abdominal pain.  Reports history of blood clot, history of CVA, on blood thinners.  States that ambulation and exertion worsens chest pain.  Denies history of MI.  Review of Systems  Positive:  Negative:   Physical Exam  BP (!) 169/92 (BP Location: Right Arm)   Pulse 90   Temp 98.8 F (37.1 C)   Resp 20   Ht 5\' 10"  (1.778 m)   Wt 82.6 kg   SpO2 96%   BMI 26.13 kg/m  Gen:   Awake, no distress   Resp:  Normal effort  MSK:   Moves extremities without difficulty  Other:    Medical Decision Making  Medically screening exam initiated at 6:19 PM.  Appropriate orders placed.  Christopher Kirk was informed that the remainder of the evaluation will be completed by another provider, this initial triage assessment does not replace that evaluation, and the importance of remaining in the ED until their evaluation is complete.     Al Decant, PA-C 04/11/23 1820

## 2023-04-12 MED ORDER — TRAMADOL HCL 50 MG PO TABS: 50.0000 mg | ORAL_TABLET | Freq: Four times a day (QID) | ORAL | 0 refills | Status: DC | PRN

## 2023-04-12 NOTE — Discharge Instructions (Signed)
 Your cardiac work-up today was overall reassuring. There is some plaque in your arteries on CT so we are referring you to cardiology. They should be contacting you with appt.  You can follow-up with their office if you do not hear from them in the next few days. Return here for new concerns.

## 2023-04-12 NOTE — ED Notes (Signed)
..  The patient is A&OX4, ambulatory at d/c with independent steady gait, NAD but wheeled out of ED via wheelchair. Pt verbalized understanding of d/c instructions, prescription and follow up care.

## 2023-04-16 DIAGNOSIS — Z1339 Encounter for screening examination for other mental health and behavioral disorders: Secondary | ICD-10-CM | POA: Diagnosis not present

## 2023-04-16 DIAGNOSIS — I10 Essential (primary) hypertension: Secondary | ICD-10-CM | POA: Diagnosis not present

## 2023-04-16 DIAGNOSIS — R519 Headache, unspecified: Secondary | ICD-10-CM | POA: Diagnosis not present

## 2023-04-16 DIAGNOSIS — Z1331 Encounter for screening for depression: Secondary | ICD-10-CM | POA: Diagnosis not present

## 2023-04-16 DIAGNOSIS — R82998 Other abnormal findings in urine: Secondary | ICD-10-CM | POA: Diagnosis not present

## 2023-04-16 DIAGNOSIS — E041 Nontoxic single thyroid nodule: Secondary | ICD-10-CM | POA: Diagnosis not present

## 2023-04-16 DIAGNOSIS — R2681 Unsteadiness on feet: Secondary | ICD-10-CM | POA: Diagnosis not present

## 2023-04-16 DIAGNOSIS — Z1211 Encounter for screening for malignant neoplasm of colon: Secondary | ICD-10-CM | POA: Diagnosis not present

## 2023-04-16 DIAGNOSIS — R0789 Other chest pain: Secondary | ICD-10-CM | POA: Diagnosis not present

## 2023-04-16 DIAGNOSIS — M5380 Other specified dorsopathies, site unspecified: Secondary | ICD-10-CM | POA: Diagnosis not present

## 2023-04-16 DIAGNOSIS — Z8673 Personal history of transient ischemic attack (TIA), and cerebral infarction without residual deficits: Secondary | ICD-10-CM | POA: Diagnosis not present

## 2023-04-16 DIAGNOSIS — Z0000 Encounter for general adult medical examination without abnormal findings: Secondary | ICD-10-CM | POA: Diagnosis not present

## 2023-04-16 DIAGNOSIS — E119 Type 2 diabetes mellitus without complications: Secondary | ICD-10-CM | POA: Diagnosis not present

## 2023-04-16 DIAGNOSIS — R634 Abnormal weight loss: Secondary | ICD-10-CM | POA: Diagnosis not present

## 2023-04-16 DIAGNOSIS — E785 Hyperlipidemia, unspecified: Secondary | ICD-10-CM | POA: Diagnosis not present

## 2023-04-16 DIAGNOSIS — K279 Peptic ulcer, site unspecified, unspecified as acute or chronic, without hemorrhage or perforation: Secondary | ICD-10-CM | POA: Diagnosis not present

## 2023-04-16 DIAGNOSIS — E118 Type 2 diabetes mellitus with unspecified complications: Secondary | ICD-10-CM | POA: Diagnosis not present

## 2023-04-17 ENCOUNTER — Other Ambulatory Visit (HOSPITAL_COMMUNITY): Payer: Self-pay | Admitting: Internal Medicine

## 2023-04-17 DIAGNOSIS — E041 Nontoxic single thyroid nodule: Secondary | ICD-10-CM

## 2023-04-21 ENCOUNTER — Ambulatory Visit (HOSPITAL_COMMUNITY)
Admission: RE | Admit: 2023-04-21 | Discharge: 2023-04-21 | Disposition: A | Discharge status: Home / Self Care | Source: Ambulatory Visit | Attending: Internal Medicine | Admitting: Internal Medicine

## 2023-04-21 DIAGNOSIS — E041 Nontoxic single thyroid nodule: Secondary | ICD-10-CM | POA: Diagnosis not present

## 2023-04-21 DIAGNOSIS — E042 Nontoxic multinodular goiter: Secondary | ICD-10-CM | POA: Diagnosis not present

## 2023-05-04 DIAGNOSIS — Z1211 Encounter for screening for malignant neoplasm of colon: Secondary | ICD-10-CM | POA: Diagnosis not present

## 2023-05-09 ENCOUNTER — Other Ambulatory Visit: Payer: Self-pay | Admitting: Internal Medicine

## 2023-05-09 DIAGNOSIS — E041 Nontoxic single thyroid nodule: Secondary | ICD-10-CM

## 2023-05-26 ENCOUNTER — Ambulatory Visit
Admission: RE | Admit: 2023-05-26 | Discharge: 2023-05-26 | Disposition: A | Discharge status: Home / Self Care | Source: Ambulatory Visit | Attending: Internal Medicine | Admitting: Internal Medicine

## 2023-05-26 ENCOUNTER — Other Ambulatory Visit (HOSPITAL_COMMUNITY)
Admission: RE | Admit: 2023-05-26 | Discharge: 2023-05-26 | Disposition: A | Discharge status: Home / Self Care | Source: Ambulatory Visit | Attending: Student | Admitting: Student

## 2023-05-26 DIAGNOSIS — E041 Nontoxic single thyroid nodule: Secondary | ICD-10-CM | POA: Insufficient documentation

## 2023-05-27 LAB — CYTOLOGY - NON PAP

## 2023-09-03 DIAGNOSIS — E041 Nontoxic single thyroid nodule: Secondary | ICD-10-CM | POA: Diagnosis not present

## 2023-09-03 DIAGNOSIS — M5380 Other specified dorsopathies, site unspecified: Secondary | ICD-10-CM | POA: Diagnosis not present

## 2023-09-03 DIAGNOSIS — E785 Hyperlipidemia, unspecified: Secondary | ICD-10-CM | POA: Diagnosis not present

## 2023-09-03 DIAGNOSIS — K279 Peptic ulcer, site unspecified, unspecified as acute or chronic, without hemorrhage or perforation: Secondary | ICD-10-CM | POA: Diagnosis not present

## 2023-09-03 DIAGNOSIS — R2681 Unsteadiness on feet: Secondary | ICD-10-CM | POA: Diagnosis not present

## 2023-09-03 DIAGNOSIS — R519 Headache, unspecified: Secondary | ICD-10-CM | POA: Diagnosis not present

## 2023-09-03 DIAGNOSIS — I10 Essential (primary) hypertension: Secondary | ICD-10-CM | POA: Diagnosis not present

## 2023-09-03 DIAGNOSIS — Z1211 Encounter for screening for malignant neoplasm of colon: Secondary | ICD-10-CM | POA: Diagnosis not present

## 2023-09-03 DIAGNOSIS — E118 Type 2 diabetes mellitus with unspecified complications: Secondary | ICD-10-CM | POA: Diagnosis not present

## 2023-09-03 DIAGNOSIS — R634 Abnormal weight loss: Secondary | ICD-10-CM | POA: Diagnosis not present

## 2023-09-16 DIAGNOSIS — Z8673 Personal history of transient ischemic attack (TIA), and cerebral infarction without residual deficits: Secondary | ICD-10-CM | POA: Diagnosis not present

## 2023-09-16 DIAGNOSIS — R195 Other fecal abnormalities: Secondary | ICD-10-CM | POA: Diagnosis not present

## 2023-10-01 ENCOUNTER — Other Ambulatory Visit: Payer: Self-pay | Admitting: Adult Health

## 2023-10-08 DIAGNOSIS — K621 Rectal polyp: Secondary | ICD-10-CM | POA: Diagnosis not present

## 2023-10-08 DIAGNOSIS — Z1211 Encounter for screening for malignant neoplasm of colon: Secondary | ICD-10-CM | POA: Diagnosis not present

## 2023-10-08 DIAGNOSIS — K635 Polyp of colon: Secondary | ICD-10-CM | POA: Diagnosis not present

## 2023-10-08 DIAGNOSIS — R195 Other fecal abnormalities: Secondary | ICD-10-CM | POA: Diagnosis not present

## 2023-10-10 DIAGNOSIS — K635 Polyp of colon: Secondary | ICD-10-CM | POA: Diagnosis not present

## 2023-10-10 DIAGNOSIS — K621 Rectal polyp: Secondary | ICD-10-CM | POA: Diagnosis not present

## 2023-10-31 DIAGNOSIS — H524 Presbyopia: Secondary | ICD-10-CM | POA: Diagnosis not present

## 2023-10-31 DIAGNOSIS — E109 Type 1 diabetes mellitus without complications: Secondary | ICD-10-CM | POA: Diagnosis not present

## 2023-10-31 DIAGNOSIS — H2513 Age-related nuclear cataract, bilateral: Secondary | ICD-10-CM | POA: Diagnosis not present

## 2023-12-13 ENCOUNTER — Emergency Department (HOSPITAL_COMMUNITY)

## 2023-12-13 ENCOUNTER — Encounter (HOSPITAL_COMMUNITY)
Admission: EM | Disposition: A | Discharge status: Home / Self Care | Payer: Self-pay | Source: Home / Self Care | Attending: Internal Medicine

## 2023-12-13 ENCOUNTER — Inpatient Hospital Stay (HOSPITAL_COMMUNITY): Admitting: Anesthesiology

## 2023-12-13 ENCOUNTER — Encounter (HOSPITAL_COMMUNITY): Payer: Self-pay

## 2023-12-13 ENCOUNTER — Other Ambulatory Visit: Payer: Self-pay

## 2023-12-13 ENCOUNTER — Inpatient Hospital Stay (HOSPITAL_COMMUNITY)
Admission: EM | Admit: 2023-12-13 | Discharge: 2023-12-15 | DRG: 393 | Disposition: A | Discharge status: Home / Self Care | Attending: Emergency Medicine | Admitting: Emergency Medicine

## 2023-12-13 DIAGNOSIS — Z8673 Personal history of transient ischemic attack (TIA), and cerebral infarction without residual deficits: Secondary | ICD-10-CM

## 2023-12-13 DIAGNOSIS — E872 Acidosis, unspecified: Secondary | ICD-10-CM | POA: Diagnosis present

## 2023-12-13 DIAGNOSIS — I10 Essential (primary) hypertension: Secondary | ICD-10-CM | POA: Diagnosis not present

## 2023-12-13 DIAGNOSIS — Z79899 Other long term (current) drug therapy: Secondary | ICD-10-CM

## 2023-12-13 DIAGNOSIS — K209 Esophagitis, unspecified without bleeding: Secondary | ICD-10-CM | POA: Diagnosis not present

## 2023-12-13 DIAGNOSIS — E785 Hyperlipidemia, unspecified: Secondary | ICD-10-CM | POA: Diagnosis present

## 2023-12-13 DIAGNOSIS — Z7902 Long term (current) use of antithrombotics/antiplatelets: Secondary | ICD-10-CM

## 2023-12-13 DIAGNOSIS — N179 Acute kidney failure, unspecified: Secondary | ICD-10-CM | POA: Diagnosis present

## 2023-12-13 DIAGNOSIS — N400 Enlarged prostate without lower urinary tract symptoms: Secondary | ICD-10-CM | POA: Diagnosis not present

## 2023-12-13 DIAGNOSIS — Z823 Family history of stroke: Secondary | ICD-10-CM

## 2023-12-13 DIAGNOSIS — R1310 Dysphagia, unspecified: Secondary | ICD-10-CM | POA: Diagnosis not present

## 2023-12-13 DIAGNOSIS — W44F3XA Food entering into or through a natural orifice, initial encounter: Principal | ICD-10-CM | POA: Diagnosis present

## 2023-12-13 DIAGNOSIS — K2210 Ulcer of esophagus without bleeding: Secondary | ICD-10-CM | POA: Diagnosis not present

## 2023-12-13 DIAGNOSIS — Z87891 Personal history of nicotine dependence: Secondary | ICD-10-CM

## 2023-12-13 DIAGNOSIS — E1165 Type 2 diabetes mellitus with hyperglycemia: Secondary | ICD-10-CM | POA: Diagnosis present

## 2023-12-13 DIAGNOSIS — R0789 Other chest pain: Secondary | ICD-10-CM | POA: Diagnosis not present

## 2023-12-13 DIAGNOSIS — K219 Gastro-esophageal reflux disease without esophagitis: Secondary | ICD-10-CM | POA: Diagnosis present

## 2023-12-13 DIAGNOSIS — Z8711 Personal history of peptic ulcer disease: Secondary | ICD-10-CM

## 2023-12-13 DIAGNOSIS — T18128A Food in esophagus causing other injury, initial encounter: Secondary | ICD-10-CM | POA: Diagnosis not present

## 2023-12-13 DIAGNOSIS — E860 Dehydration: Secondary | ICD-10-CM | POA: Diagnosis present

## 2023-12-13 DIAGNOSIS — Z8719 Personal history of other diseases of the digestive system: Secondary | ICD-10-CM

## 2023-12-13 DIAGNOSIS — R140 Abdominal distension (gaseous): Secondary | ICD-10-CM | POA: Diagnosis not present

## 2023-12-13 DIAGNOSIS — I639 Cerebral infarction, unspecified: Secondary | ICD-10-CM | POA: Diagnosis not present

## 2023-12-13 DIAGNOSIS — T182XXA Foreign body in stomach, initial encounter: Secondary | ICD-10-CM

## 2023-12-13 DIAGNOSIS — Z7984 Long term (current) use of oral hypoglycemic drugs: Secondary | ICD-10-CM

## 2023-12-13 DIAGNOSIS — K2100 Gastro-esophageal reflux disease with esophagitis, without bleeding: Secondary | ICD-10-CM | POA: Diagnosis not present

## 2023-12-13 DIAGNOSIS — D72829 Elevated white blood cell count, unspecified: Secondary | ICD-10-CM | POA: Diagnosis present

## 2023-12-13 DIAGNOSIS — I700 Atherosclerosis of aorta: Secondary | ICD-10-CM | POA: Diagnosis not present

## 2023-12-13 HISTORY — PX: ESOPHAGOGASTRODUODENOSCOPY: SHX5428

## 2023-12-13 LAB — CBC WITH DIFFERENTIAL/PLATELET
Abs Immature Granulocytes: 0.19 K/uL — ABNORMAL HIGH (ref 0.00–0.07)
Basophils Absolute: 0.1 K/uL (ref 0.0–0.1)
Basophils Relative: 0 %
Eosinophils Absolute: 0 K/uL (ref 0.0–0.5)
Eosinophils Relative: 0 %
HCT: 54.8 % — ABNORMAL HIGH (ref 39.0–52.0)
Hemoglobin: 17.6 g/dL — ABNORMAL HIGH (ref 13.0–17.0)
Immature Granulocytes: 1 %
Lymphocytes Relative: 9 %
Lymphs Abs: 2 K/uL (ref 0.7–4.0)
MCH: 30.7 pg (ref 26.0–34.0)
MCHC: 32.1 g/dL (ref 30.0–36.0)
MCV: 95.5 fL (ref 80.0–100.0)
Monocytes Absolute: 1.6 K/uL — ABNORMAL HIGH (ref 0.1–1.0)
Monocytes Relative: 7 %
Neutro Abs: 19.2 K/uL — ABNORMAL HIGH (ref 1.7–7.7)
Neutrophils Relative %: 83 %
Platelets: 368 K/uL (ref 150–400)
RBC: 5.74 MIL/uL (ref 4.22–5.81)
RDW: 13.3 % (ref 11.5–15.5)
WBC: 23.1 K/uL — ABNORMAL HIGH (ref 4.0–10.5)
nRBC: 0 % (ref 0.0–0.2)

## 2023-12-13 LAB — COMPREHENSIVE METABOLIC PANEL WITH GFR
ALT: 24 U/L (ref 0–44)
AST: 19 U/L (ref 15–41)
Albumin: 4.9 g/dL (ref 3.5–5.0)
Alkaline Phosphatase: 93 U/L (ref 38–126)
Anion gap: 28 — ABNORMAL HIGH (ref 5–15)
BUN: 39 mg/dL — ABNORMAL HIGH (ref 8–23)
CO2: 12 mmol/L — ABNORMAL LOW (ref 22–32)
Calcium: 10.1 mg/dL (ref 8.9–10.3)
Chloride: 100 mmol/L (ref 98–111)
Creatinine, Ser: 1.62 mg/dL — ABNORMAL HIGH (ref 0.61–1.24)
GFR, Estimated: 45 mL/min — ABNORMAL LOW (ref >60)
Glucose, Bld: 192 mg/dL — ABNORMAL HIGH (ref 70–99)
Potassium: 4.5 mmol/L (ref 3.5–5.1)
Sodium: 140 mmol/L (ref 135–145)
Total Bilirubin: 2.9 mg/dL — ABNORMAL HIGH (ref 0.0–1.2)
Total Protein: 8.1 g/dL (ref 6.5–8.1)

## 2023-12-13 LAB — CK: Total CK: 47 U/L — ABNORMAL LOW (ref 49–397)

## 2023-12-13 LAB — I-STAT CHEM 8, ED
BUN: 50 mg/dL — ABNORMAL HIGH (ref 8–23)
Calcium, Ion: 1.22 mmol/L (ref 1.15–1.40)
Chloride: 108 mmol/L (ref 98–111)
Creatinine, Ser: 1 mg/dL (ref 0.61–1.24)
Glucose, Bld: 190 mg/dL — ABNORMAL HIGH (ref 70–99)
HCT: 55 % — ABNORMAL HIGH (ref 39.0–52.0)
Hemoglobin: 18.7 g/dL — ABNORMAL HIGH (ref 13.0–17.0)
Potassium: 4.6 mmol/L (ref 3.5–5.1)
Sodium: 139 mmol/L (ref 135–145)
TCO2: 13 mmol/L — ABNORMAL LOW (ref 22–32)

## 2023-12-13 LAB — CBG MONITORING, ED: Glucose-Capillary: 197 mg/dL — ABNORMAL HIGH (ref 70–99)

## 2023-12-13 LAB — I-STAT CG4 LACTIC ACID, ED: Lactic Acid, Venous: 2.8 mmol/L (ref 0.5–1.9)

## 2023-12-13 LAB — GAMMA GT: GGT: 15 U/L (ref 7–50)

## 2023-12-13 LAB — TROPONIN I (HIGH SENSITIVITY)
Troponin I (High Sensitivity): 6 ng/L (ref <18)
Troponin I (High Sensitivity): 9 ng/L (ref <18)

## 2023-12-13 LAB — GLUCOSE, CAPILLARY
Glucose-Capillary: 134 mg/dL — ABNORMAL HIGH (ref 70–99)
Glucose-Capillary: 199 mg/dL — ABNORMAL HIGH (ref 70–99)
Glucose-Capillary: 285 mg/dL — ABNORMAL HIGH (ref 70–99)

## 2023-12-13 LAB — LIPASE, BLOOD: Lipase: 164 U/L — ABNORMAL HIGH (ref 11–51)

## 2023-12-13 SURGERY — EGD (ESOPHAGOGASTRODUODENOSCOPY)
Anesthesia: General

## 2023-12-13 MED ORDER — OXYCODONE HCL 5 MG PO TABS: 5.0000 mg | ORAL_TABLET | Freq: Once | ORAL | Status: DC | PRN

## 2023-12-13 MED ORDER — HEPARIN SODIUM (PORCINE) 5000 UNIT/ML IJ SOLN
5000.0000 [IU] | Freq: Three times a day (TID) | INTRAMUSCULAR | Status: DC
  Administered 2023-12-13 – 2023-12-15 (×6): 5000 [IU] via SUBCUTANEOUS
  Filled 2023-12-13 (×6): qty 1

## 2023-12-13 MED ORDER — OXYCODONE HCL 5 MG/5ML PO SOLN: 5.0000 mg | Freq: Once | ORAL | Status: DC | PRN

## 2023-12-13 MED ORDER — SODIUM CHLORIDE 0.9% FLUSH
3.0000 mL | Freq: Two times a day (BID) | INTRAVENOUS | Status: DC
  Administered 2023-12-14 – 2023-12-15 (×3): 3 mL via INTRAVENOUS

## 2023-12-13 MED ORDER — ACETAMINOPHEN 650 MG RE SUPP: 650.0000 mg | Freq: Four times a day (QID) | RECTAL | Status: DC | PRN

## 2023-12-13 MED ORDER — SODIUM CHLORIDE 0.9 % IV SOLN: INTRAVENOUS | Status: AC

## 2023-12-13 MED ORDER — ONDANSETRON HCL 4 MG/2ML IJ SOLN: 4.0000 mg | Freq: Four times a day (QID) | INTRAMUSCULAR | Status: DC | PRN

## 2023-12-13 MED ORDER — PANTOPRAZOLE SODIUM 40 MG IV SOLR
40.0000 mg | Freq: Two times a day (BID) | INTRAVENOUS | Status: DC
  Administered 2023-12-13 – 2023-12-15 (×4): 40 mg via INTRAVENOUS
  Filled 2023-12-13 (×4): qty 10

## 2023-12-13 MED ORDER — LIDOCAINE HCL (CARDIAC) PF 100 MG/5ML IV SOSY
PREFILLED_SYRINGE | INTRAVENOUS | Status: DC | PRN
  Administered 2023-12-13: 60 mg via INTRAVENOUS

## 2023-12-13 MED ORDER — ONDANSETRON HCL 4 MG/2ML IJ SOLN
INTRAMUSCULAR | Status: DC | PRN
  Administered 2023-12-13: 4 mg via INTRAVENOUS

## 2023-12-13 MED ORDER — SODIUM CHLORIDE 0.9 % IV BOLUS: 1000.0000 mL | Freq: Once | INTRAVENOUS | Status: DC

## 2023-12-13 MED ORDER — INSULIN ASPART 100 UNIT/ML IJ SOLN
0.0000 [IU] | INTRAMUSCULAR | Status: DC
  Administered 2023-12-13: 2 [IU] via SUBCUTANEOUS
  Administered 2023-12-14 (×3): 1 [IU] via SUBCUTANEOUS
  Administered 2023-12-14 (×2): 2 [IU] via SUBCUTANEOUS
  Administered 2023-12-14 – 2023-12-15 (×2): 1 [IU] via SUBCUTANEOUS
  Administered 2023-12-15: 2 [IU] via SUBCUTANEOUS
  Administered 2023-12-15: 1 [IU] via SUBCUTANEOUS
  Administered 2023-12-15: 2 [IU] via SUBCUTANEOUS
  Filled 2023-12-13 (×2): qty 2
  Filled 2023-12-13 (×2): qty 1
  Filled 2023-12-13: qty 2
  Filled 2023-12-13: qty 1
  Filled 2023-12-13 (×3): qty 2

## 2023-12-13 MED ORDER — FENTANYL CITRATE (PF) 50 MCG/ML IJ SOSY
50.0000 ug | PREFILLED_SYRINGE | Freq: Once | INTRAMUSCULAR | Status: AC
  Administered 2023-12-13: 50 ug via INTRAVENOUS
  Filled 2023-12-13: qty 1

## 2023-12-13 MED ORDER — SODIUM CHLORIDE 0.9 % IV SOLN: INTRAVENOUS | Status: DC

## 2023-12-13 MED ORDER — HYDRALAZINE HCL 20 MG/ML IJ SOLN: 10.0000 mg | Freq: Four times a day (QID) | INTRAMUSCULAR | Status: DC | PRN

## 2023-12-13 MED ORDER — ACETAMINOPHEN 325 MG PO TABS: 650.0000 mg | ORAL_TABLET | Freq: Four times a day (QID) | ORAL | Status: DC | PRN

## 2023-12-13 MED ORDER — IOHEXOL 350 MG/ML SOLN
100.0000 mL | Freq: Once | INTRAVENOUS | Status: AC | PRN
  Administered 2023-12-13: 100 mL via INTRAVENOUS

## 2023-12-13 MED ORDER — SODIUM CHLORIDE 0.9 % IV SOLN: INTRAVENOUS | Status: DC | PRN

## 2023-12-13 MED ORDER — ONDANSETRON HCL 4 MG/2ML IJ SOLN
4.0000 mg | Freq: Once | INTRAMUSCULAR | Status: AC
  Administered 2023-12-13: 4 mg via INTRAVENOUS
  Filled 2023-12-13: qty 2

## 2023-12-13 MED ORDER — SUCCINYLCHOLINE 20MG/ML (10ML) SYRINGE FOR MEDFUSION PUMP - OPTIME
INTRAMUSCULAR | Status: DC | PRN
  Administered 2023-12-13: 120 mg via INTRAVENOUS

## 2023-12-13 MED ORDER — PHENYLEPHRINE HCL (PRESSORS) 10 MG/ML IV SOLN
INTRAVENOUS | Status: DC | PRN
  Administered 2023-12-13: 80 ug via INTRAVENOUS
  Administered 2023-12-13 (×2): 160 ug via INTRAVENOUS

## 2023-12-13 MED ORDER — FENTANYL CITRATE (PF) 100 MCG/2ML IJ SOLN: 25.0000 ug | INTRAMUSCULAR | Status: DC | PRN

## 2023-12-13 MED ORDER — DEXAMETHASONE SOD PHOSPHATE PF 10 MG/ML IJ SOLN
INTRAMUSCULAR | Status: DC | PRN
  Administered 2023-12-13: 10 mg via INTRAVENOUS

## 2023-12-13 MED ORDER — PROPOFOL 10 MG/ML IV BOLUS
INTRAVENOUS | Status: DC | PRN
Start: 2023-12-13 — End: 2023-12-13
  Administered 2023-12-13: 200 mg via INTRAVENOUS

## 2023-12-13 MED ORDER — DEXTROSE 5 % IN LACTATED RINGERS IV BOLUS
1000.0000 mL | Freq: Once | INTRAVENOUS | Status: AC
  Administered 2023-12-13: 1000 mL via INTRAVENOUS

## 2023-12-13 NOTE — ED Triage Notes (Signed)
 Pt states that he swallowed a piece of chicken Thursday night & felt like it became stuck, since then he has been having a hard time holding things down. Endorses abd pain with the n/v he is having, A/Ox4, rates his pain 9/10 during triage.

## 2023-12-13 NOTE — Op Note (Signed)
 [facility] Patient Name: Christopher Kirk Procedure Date : 12/13/2023 MRN: [id number] Attending MD: Belvie Just , MD, [id number] Date of Birth: Jan 05, 1951 CSN: [id number] Age: 73 Admit Type: Inpatient Procedure:                Upper GI endoscopy Indications:              Dysphagia Providers:                Belvie Just, MD, Collene Edu, RN, Haskel Chris,                            Technician Referring MD:              Medicines:                General Anesthesia Complications:            No immediate complications. Estimated Blood Loss:     Estimated blood loss: none. Procedure:                Pre-Anesthesia Assessment:                           - Prior to the procedure, a History and Physical                            was performed, and patient medications and                            allergies were reviewed. The patient's tolerance of                            previous anesthesia was also reviewed. The risks                            and benefits of the procedure and the sedation                            options and risks were discussed with the patient.                            All questions were answered, and informed consent                            was obtained. Prior Anticoagulants: The patient has                            taken no anticoagulant or antiplatelet agents. ASA                            Grade Assessment: III - A patient with severe                            systemic disease. After reviewing the risks and                            benefits,  the patient was deemed in satisfactory                            condition to undergo the procedure.                           - Sedation was administered by an anesthesia                            professional. General anesthesia was attained.                           After obtaining informed consent, the endoscope was                            passed under direct vision. Throughout the                             procedure, the patient's blood pressure, pulse, and                            oxygen saturations were monitored continuously. The                            [id number]-[id number] ([id number]) Olympus endoscope was introduced                            through the mouth, and advanced to the second part                            of duodenum. The upper GI endoscopy was                            accomplished without difficulty. The patient                            tolerated the procedure well. Scope In: Scope Out: Findings:      LA Grade D (one or more mucosal breaks involving at least 75% of       esophageal circumference) esophagitis with no bleeding was found in the       entire esophagus. Biopsies were taken with a cold forceps for histology.      One cratered esophageal ulcer was found at the gastroesophageal       junction. Biopsies were taken with a cold forceps for histology.      A large amount of food (residue) was found in the gastric fundus and in       the gastric body.      The examined duodenum was normal.      A severe esophagitis was identified. The exact etiology of the       esophagitis was unknown. An ulcer was found at the GE junction, but       clear visualization was not possible with the edema. There were no       discrete ulcerations in the other portion of the esophagus to suggest a  viral etiology. Cold biopsies were obtained throughout the entire       esophagus. Impression:               - LA Grade D esophagitis with no bleeding. Biopsied.                           - Esophageal ulcer. Biopsied.                           - A large amount of food (residue) in the stomach.                           - Normal examined duodenum. Recommendation:           - Return patient to hospital ward for ongoing care.                           - NPO.                           - Continue with pantoprazole 40 mg IV BID. Procedure Code(s):        --- Professional ---                            [phone], Esophagogastroduodenoscopy, flexible,                            transoral; with biopsy, single or multiple Diagnosis Code(s):        --- Professional ---                           K20.90, Esophagitis, unspecified without bleeding                           K22.10, Ulcer of esophagus without bleeding                           R13.10, Dysphagia, unspecified CPT copyright 2022 [employer]. All rights reserved. The codes documented in this report are preliminary and upon coder review may  be revised to meet current compliance requirements. Belvie Just, MD Belvie Just, MD 12/13/2023 7:03:12 PM This report has been signed electronically. Number of Addenda: 0

## 2023-12-13 NOTE — Anesthesia Preprocedure Evaluation (Signed)
 Anesthesia Evaluation  Patient identified by MRN, date of birth, ID band Patient awake    Reviewed: Allergy & Precautions, H&P , NPO status , Patient's Chart, lab work & pertinent test results  Airway Mallampati: II   Neck ROM: full    Dental   Pulmonary former smoker   breath sounds clear to auscultation       Cardiovascular hypertension,  Rhythm:regular Rate:Normal     Neuro/Psych  Headaches CVA    GI/Hepatic ,GERD  ,,  Endo/Other  diabetes, Type 2    Renal/GU      Musculoskeletal   Abdominal   Peds  Hematology   Anesthesia Other Findings   Reproductive/Obstetrics                              Anesthesia Physical Anesthesia Plan  ASA: 3  Anesthesia Plan: General   Post-op Pain Management:    Induction: Intravenous and Rapid sequence  PONV Risk Score and Plan: 2 and Ondansetron , Dexamethasone and Treatment may vary due to age or medical condition  Airway Management Planned: Oral ETT  Additional Equipment:   Intra-op Plan:   Post-operative Plan: Extubation in OR  Informed Consent: I have reviewed the patients History and Physical, chart, labs and discussed the procedure including the risks, benefits and alternatives for the proposed anesthesia with the patient or authorized representative who has indicated his/her understanding and acceptance.     Dental advisory given  Plan Discussed with: CRNA, Anesthesiologist and Surgeon  Anesthesia Plan Comments:         Anesthesia Quick Evaluation

## 2023-12-13 NOTE — Anesthesia Procedure Notes (Signed)
 Procedure Name: Intubation Date/Time: 12/13/2023 6:39 PM  Performed by: Vaughn Zebedee HERO, CRNAPre-anesthesia Checklist: Patient identified, Emergency Drugs available, Suction available and Patient being monitored Patient Re-evaluated:Patient Re-evaluated prior to induction Oxygen Delivery Method: Circle system utilized Preoxygenation: Pre-oxygenation with 100% oxygen Induction Type: IV induction, Rapid sequence and Cricoid Pressure applied Ventilation: Mask ventilation without difficulty Laryngoscope Size: Mac and 4 Grade View: Grade II Tube type: Oral Tube size: 8.0 mm Number of attempts: 2 Airway Equipment and Method: Stylet Placement Confirmation: ETT inserted through vocal cords under direct vision, positive ETCO2 and breath sounds checked- equal and bilateral Secured at: 23 cm Tube secured with: Tape Dental Injury: Teeth and Oropharynx as per pre-operative assessment  Comments: DL x 1 by TGCRNA with Miller 2, unable to visualize cords. Easy mask ventilation. DL x 1 by AH, MD with Mac 4, intubated without trauma.

## 2023-12-13 NOTE — ED Provider Notes (Signed)
 [place] EMERGENCY DEPARTMENT AT [facility] Provider Note   CSN: [id number] Arrival date & time: 12/13/23  1202     Patient presents with: Nausea and Emesis   Christopher Kirk is a 73 y.o. male with history of CVA, diabetes, presents to the emerged from today for evaluation of substernal chest pain/epigastric pain.  On Thursday, the patient reports that he was eating chicken when he felt it gets stuck in the substernal chest pain/epigastric region.  Denies any radiation of the pain.  He reports that since then he has been unable to tolerate any foods or fluids.  He is able to tolerate his own secretions.  Denies any fever or shortness of breath.  He denies any previous history of food boluses or endoscopies.  He denies familiarity with esophageal strictures.  He reports that he feels very fatigued and dehydrated given that he is not able to tolerate much p.o.  Reports that he is feeling some lightheadedness but no shortness of breath.  Denies any fever.  Still urinated today.  Not having any diarrhea or constipation.  Still passing gas.   Emesis Associated symptoms: abdominal pain   Associated symptoms: no chills, no cough, no diarrhea and no fever        Prior to Admission medications   Medication Sig Start Date End Date Taking? Authorizing Provider  amLODipine  (NORVASC ) 10 MG tablet Take 1 tablet (10 mg total) by mouth daily. Patient taking differently: Take 5 mg by mouth daily. 1/2 tab daily 04/09/20   Perri DELENA Meliton Mickey., MD  atorvastatin  (LIPITOR ) 80 MG tablet TAKE 1 TABLET BY MOUTH EVERY DAY 01/28/23   Whitfield Raisin, NP  carvedilol  (COREG ) 6.25 MG tablet Take 1 tablet (6.25 mg total) by mouth 2 (two) times daily. 04/10/20   Perri DELENA Meliton Mickey., MD  clopidogrel  (PLAVIX ) 75 MG tablet Take 1 tablet (75 mg total) by mouth daily. 05/10/20   Whitfield Raisin, NP  irbesartan  (AVAPRO ) 300 MG tablet Take 1 tablet (300 mg total) by mouth daily. 04/10/20   Perri DELENA Meliton Mickey.,  MD  metFORMIN (GLUCOPHAGE) 1000 MG tablet Take 500 mg by mouth 2 (two) times daily. 1/2 tab daily 02/29/20   [provider]  pantoprazole (PROTONIX) 20 MG tablet Take 20 mg by mouth daily.    [provider]  topiramate  (TOPAMAX ) 25 MG tablet Take 1 tablet (25 mg total) by mouth at bedtime. 01/07/22   Whitfield Raisin, NP  traMADol  (ULTRAM ) 50 MG tablet Take 1 tablet (50 mg total) by mouth every 6 (six) hours as needed. 04/12/23   Jarold Olam HERO, PA-C    Allergies: Patient has no known allergies.    Review of Systems  Constitutional:  Positive for fatigue. Negative for chills and fever.  Respiratory:  Negative for cough and shortness of breath.   Cardiovascular:  Positive for chest pain.  Gastrointestinal:  Positive for abdominal pain, nausea and vomiting. Negative for blood in stool, constipation and diarrhea.  Genitourinary:  Negative for dysuria and hematuria.    Updated Vital Signs BP 138/77   Pulse (!) 105   Temp 97.8 F (36.6 C) (Oral)   Resp 18   Ht 5' 11 (1.803 m)   Wt 80.3 kg   SpO2 100%   BMI 24.69 kg/m   Physical Exam Vitals and nursing note reviewed.  Constitutional:      Appearance: He is ill-appearing.  HENT:     Mouth/Throat:     Mouth: Mucous membranes  are dry.     Comments: Extremely dry mucous membranes.  His lips are sticking to his teeth. Eyes:     General: No scleral icterus. Cardiovascular:     Rate and Rhythm: Normal rate.     Pulses: Normal pulses.  Pulmonary:     Effort: Pulmonary effort is normal. No respiratory distress.     Breath sounds: Normal breath sounds.  Abdominal:     Palpations: Abdomen is soft.     Tenderness: There is abdominal tenderness. There is no guarding or rebound.     Comments: Diffuse abdominal tender palpation however is more in the epigastric region.  Soft.  Without guarding or rebound.  Musculoskeletal:     Right lower leg: No edema.     Left lower leg: No edema.  Skin:    General: Skin is warm and  dry.  Neurological:     Mental Status: He is alert.     (all labs ordered are listed, but only abnormal results are displayed) Labs Reviewed  CBC WITH DIFFERENTIAL/PLATELET - Abnormal; Notable for the following components:      Result Value   WBC 23.1 (*)    Hemoglobin 17.6 (*)    HCT 54.8 (*)    Neutro Abs 19.2 (*)    Monocytes Absolute 1.6 (*)    Abs Immature Granulocytes 0.19 (*)    All other components within normal limits  COMPREHENSIVE METABOLIC PANEL WITH GFR - Abnormal; Notable for the following components:   CO2 12 (*)    Glucose, Bld 192 (*)    BUN 39 (*)    Creatinine, Ser 1.62 (*)    Total Bilirubin 2.9 (*)    GFR, Estimated 45 (*)    Anion gap 28 (*)    All other components within normal limits  LIPASE, BLOOD - Abnormal; Notable for the following components:   Lipase 164 (*)    All other components within normal limits  CBG MONITORING, ED - Abnormal; Notable for the following components:   Glucose-Capillary 197 (*)    All other components within normal limits  I-STAT CHEM 8, ED - Abnormal; Notable for the following components:   BUN 50 (*)    Glucose, Bld 190 (*)    TCO2 13 (*)    Hemoglobin 18.7 (*)    HCT 55.0 (*)    All other components within normal limits  URINALYSIS, ROUTINE W REFLEX MICROSCOPIC  CK  GAMMA GT  OSMOLALITY  I-STAT CG4 LACTIC ACID, ED  TROPONIN I (HIGH SENSITIVITY)  TROPONIN I (HIGH SENSITIVITY)    EKG: EKG Interpretation Date/Time:  Saturday December 13 2023 12:44:02 EST Ventricular Rate:  100 PR Interval:  152 QRS Duration:  88 QT Interval:  354 QTC Calculation: 456 R Axis:   68  Text Interpretation: Normal sinus rhythm Normal ECG When compared with ECG of 11-Apr-2023 17:33, PREVIOUS ECG IS PRESENT Confirmed by Jerrol Agent ([id number]) on 12/13/2023 3:51:09 PM  Radiology: CT Angio Chest/Abd/Pel for Dissection W and/or Wo Contrast Result Date: 12/13/2023 EXAM: CTA CHEST, ABDOMEN AND PELVIS WITH AND WITHOUT CONTRAST  12/13/2023 02:14:14 PM TECHNIQUE: CTA of the chest was performed with and without the administration of intravenous contrast. CTA of the abdomen and pelvis was performed with the administration of intravenous contrast. 100 mL of iohexol  (OMNIPAQUE ) 350 MG/ML injection was administered. Multiplanar reformatted images are provided for review. MIP images are provided for review. Automated exposure control, iterative reconstruction, and/or weight based adjustment of the mA/kV was utilized to  reduce the radiation dose to as low as reasonably achievable. COMPARISON: 04/11/2023 CLINICAL HISTORY: Acute aortic syndrome (AAS) suspected; concern for food bolus. FINDINGS: VASCULATURE: AORTA: Scattered descending thoracic aortic calcifications. No acute finding. No abdominal aortic aneurysm. No dissection. PULMONARY ARTERIES: No pulmonary embolism with the limits of this exam. GREAT VESSELS OF AORTIC ARCH: No acute finding. No dissection. No arterial occlusion or significant stenosis. CELIAC TRUNK: No acute finding. No occlusion or significant stenosis. SUPERIOR MESENTERIC ARTERY: No acute finding. No occlusion or significant stenosis. INFERIOR MESENTERIC ARTERY: No acute finding. No occlusion or significant stenosis. RENAL ARTERIES: Duplicated left renal arteries, superior dominant, both patent. No occlusion or significant stenosis. ILIAC ARTERIES: Bilateral pelvic vascular calcifications. moderate aortoiliac atheromatous plaque without aneurysm or high grade stenosis. No occlusion or significant stenosis. CHEST: MEDIASTINUM: Common ostium for left pulmonary veins, an anatomic variant. Scattered right coronary calcifications. No mediastinal lymphadenopathy. The heart and pericardium demonstrate no acute abnormality. LUNGS AND PLEURA: Breathing motion somewhat degrades pulmonary parenchymal evaluation. The lungs are without acute process. No focal consolidation or pulmonary edema. No evidence of pleural effusion or pneumothorax.  THORACIC BONES AND SOFT TISSUES: vertebral endplate spurring at multiple levels in the lower thoracic spine. No acute soft tissue abnormality. ABDOMEN AND PELVIS: LIVER: The liver is unremarkable. GALLBLADDER AND BILE DUCTS: Gallbladder is unremarkable. No biliary ductal dilatation. SPLEEN: The spleen is unremarkable. PANCREAS: The pancreas is unremarkable. ADRENAL GLANDS: Bilateral adrenal glands demonstrate no acute abnormality. KIDNEYS, URETERS AND BLADDER: No stones in the kidneys or ureters. No hydronephrosis. No perinephric or periureteral stranding. Urinary bladder is unremarkable. GI AND BOWEL: The mid and distal esophagus is mildly distended. Stomach and duodenal sweep demonstrate no acute abnormality. There is no bowel obstruction. No abnormal bowel wall thickening or distension. normal appendix. REPRODUCTIVE: Moderate prostate enlargement. PERITONEUM AND RETROPERITONEUM: No ascites or free air. LYMPH NODES: No lymphadenopathy. ABDOMINAL BONES AND SOFT TISSUES: lumbar spondylotic change most severe L5-S1. Mild bilateral hip DJD. No acute soft tissue abnormality. IMPRESSION: 1. No acute aortic syndrome. 2. Mildly distended mid and distal esophagus, which may reflect an esophageal food bolus impaction. Electronically signed by: Katheleen Faes MD 12/13/2023 02:37 PM EST RP Workstation: [id number]   Procedures   Medications Ordered in the ED  dextrose 5% lactated ringers bolus 1,000 mL (has no administration in time range)  iohexol  (OMNIPAQUE ) 350 MG/ML injection 100 mL (100 mLs Intravenous Contrast Given 12/13/23 1414)   Medical Decision Making Amount and/or Complexity of Data Reviewed Labs: ordered. Radiology: ordered.  Risk Prescription drug management. Decision regarding hospitalization.   73 y.o. male presents to the ER for evaluation of epigastric pain. Differential diagnosis includes but is not limited to PUD, gastritis, pancreatitis, gastroparesis, malignancy, biliary disease, ACS,  pericarditis, pneumonia, intestinal ischemia, esophageal rupture, hepatitis, food bolus. Vital signs mild cardia otherwise unremarkable. Physical exam as noted above.   I initially evaluated the patient in triage.  Will order CT dissection study to rule out any dissection however I think food bolus is more likely.  Labs ordered as well.  I independently reviewed and interpreted the patient's labs.  CBC shows leukocytosis at 23.1 with a left shift.  Patient does have elevated hemoglobin and hematocrit at 17.6 and 54.8, likely in the setting of dehydration and hemoconcentration.  His troponin is at 6 with delta pending.  His lipase is elevated at 164.  CMP shows bicarb of 12 with a glucose of 192.  BUN of 39 with a creatinine of 1.62.  It appears his  baseline is around 0.8.  He does have a mildly elevated total bili at 2.9 and an anion gap of 28.  CT scan shows 1. No acute aortic syndrome. 2. Mildly distended mid and distal esophagus, which may reflect an esophageal food bolus impaction. Per radiologist's interpretation.    The patient sees [facility] for PCP. Reports that he has never seen a GI provider previously. Will page oncall GI provider given results.   I consulted GI and spoke with Dr. Rollin. Discussed lab and imaging and patient story/presentation. He reports that EGD will likely be tomorrow. Will volume resuscitate and keep the patient NPO. Given his anion gap and ketosis, this is likely from starvation ketosis given his inability to tolerate PO. The patient has urinated while in the ER, he has just forgotten to give a sample. I have ordered him D5LR to help with the ketosis and dehydration. Will need to admit to medicine. Dr. FORBES Blanch to admit.   Portions of this report may have been transcribed using voice recognition software. Every effort was made to ensure accuracy; however, inadvertent computerized transcription errors may be present.    Final diagnoses:  Food impaction of esophagus,  initial encounter    ED Discharge Orders     None          Bernis Ernst, DEVONNA 12/13/23 2047    Jerrol Agent, MD 12/13/23 2206

## 2023-12-13 NOTE — Transfer of Care (Signed)
 Immediate Anesthesia Transfer of Care Note  Patient: Christopher Kirk  Procedure(s) Performed: EGD (ESOPHAGOGASTRODUODENOSCOPY)  Patient Location: PACU  Anesthesia Type:General  Level of Consciousness: awake and sedated  Airway & Oxygen Therapy: Patient Spontanous Breathing  Post-op Assessment: Report given to RN and Post -op Vital signs reviewed and stable  Post vital signs: Reviewed, Stable  Last Vitals:  Vitals Value Taken Time  BP 131/66 12/13/23 19:08  Temp    Pulse 99 12/13/23 19:12  Resp 19 12/13/23 19:12  SpO2 97 % 12/13/23 19:12  Vitals shown include unfiled device data.  Last Pain:  Vitals:   12/13/23 1818  TempSrc:   PainSc: 0-No pain         Complications: No notable events documented.

## 2023-12-13 NOTE — ED Provider Triage Note (Addendum)
 Emergency Medicine Provider Triage Evaluation Note  DEMARIE HYNEMAN , a 73 y.o. male  was evaluated in triage.  Pt complains of epigastric pain after eating chicken.  Patient reports that he was eating chicken on Thursday and felt a piece of food get stuck.  Since then, he has been unable to tolerate liquids or solids.  He keeps having nausea and vomiting.  Denies any shortness of breath or any radiation into the back.  He denies any previous food boluses before.  Denies having a lower abdominal pain  Review of Systems  Positive:  Negative:   Physical Exam  BP 138/77   Pulse (!) 105   Temp 97.8 F (36.6 C) (Oral)   Resp 18   SpO2 100%  Gen:   Uncomfortable, dry mucous membranes, ketones.  Controlling secretions.  Resp:  Normal effort  MSK:   Moves extremities without difficulty  Other:  Mild epigastric tenderness palpation.  Medical Decision Making  Medically screening exam initiated at 12:18 PM.  Appropriate orders placed.  ERMINIO NYGARD was informed that the remainder of the evaluation will be completed by another provider, this initial triage assessment does not replace that evaluation, and the importance of remaining in the ED until their evaluation is complete.  Suspicion likely food bolus.  Will order CT scan and labs for assessment.   Bernis Ernst, PA-C 12/13/23 1232    Bernis Ernst, PA-C 12/13/23 1233

## 2023-12-13 NOTE — Consult Note (Signed)
 Reason for Consult:Food Impaction Referring Physician: [facility]  Christopher Kirk HPI: This is a 73 year old male with a PMH of GERD, a self-reported history of a hiatal hernia, DM, HTN, and CVA admitted for a food impaction.  He ate some chicken this past Thursday evening and since that time he was not able to tolerate any PO.  Any type of PO resulted in vomiting.  Over the next 1.5 days he became dehydrated and he presented to the ER.  Work up showed an elevated WBC and a CT scan of the chest was positive for a mildly dilated mid to distal esophagus.  He reports a multi-decades long history of intermittent dysphagia.  Over the course of a year he averaged 3-4 episodes of dysphagia that resulted in vomiting to relieve the symptoms.  This is the first time that his symptoms did not resolve.  Past Medical History:  Diagnosis Date   CVA (cerebral vascular accident) (HCC) 04/07/2020   Diabetes mellitus without complication (HCC)    Dyslipidemia    GERD (gastroesophageal reflux disease)    Hypertension     Past Surgical History:  Procedure Laterality Date   BACK SURGERY      Family History  Problem Relation Age of Onset   Stroke Father    Stroke Sister    Stroke Brother        x 3 brothers    Social History:  reports that he quit smoking about 38 years ago. His smoking use included cigarettes. He started smoking about 58 years ago. He has a 60 pack-year smoking history. He has never used smokeless tobacco. He reports current alcohol  use. He reports that he does not use drugs.  Allergies: No Known Allergies  Medications: Scheduled:  heparin  5,000 Units Subcutaneous Q8H   pantoprazole (PROTONIX) IV  40 mg Intravenous Q12H   sodium chloride  flush  3 mL Intravenous Q12H   Continuous:  sodium chloride       Results for orders placed or performed during the hospital encounter of 12/13/23 (from the past 24 hours)  CBC with Differential     Status: Abnormal   Collection Time:  12/13/23 12:23 PM  Result Value Ref Range   WBC 23.1 (H) 4.0 - 10.5 K/uL   RBC 5.74 4.22 - 5.81 MIL/uL   Hemoglobin 17.6 (H) 13.0 - 17.0 g/dL   HCT 45.1 (H) 60.9 - 47.9 %   MCV 95.5 80.0 - 100.0 fL   MCH 30.7 26.0 - 34.0 pg   MCHC 32.1 30.0 - 36.0 g/dL   RDW 86.6 88.4 - 84.4 %   Platelets 368 150 - 400 K/uL   nRBC 0.0 0.0 - 0.2 %   Neutrophils Relative % 83 %   Neutro Abs 19.2 (H) 1.7 - 7.7 K/uL   Lymphocytes Relative 9 %   Lymphs Abs 2.0 0.7 - 4.0 K/uL   Monocytes Relative 7 %   Monocytes Absolute 1.6 (H) 0.1 - 1.0 K/uL   Eosinophils Relative 0 %   Eosinophils Absolute 0.0 0.0 - 0.5 K/uL   Basophils Relative 0 %   Basophils Absolute 0.1 0.0 - 0.1 K/uL   Immature Granulocytes 1 %   Abs Immature Granulocytes 0.19 (H) 0.00 - 0.07 K/uL  Comprehensive metabolic panel     Status: Abnormal   Collection Time: 12/13/23 12:23 PM  Result Value Ref Range   Sodium 140 135 - 145 mmol/L   Potassium 4.5 3.5 - 5.1 mmol/L   Chloride 100  98 - 111 mmol/L   CO2 12 (L) 22 - 32 mmol/L   Glucose, Bld 192 (H) 70 - 99 mg/dL   BUN 39 (H) 8 - 23 mg/dL   Creatinine, Ser 8.37 (H) 0.61 - 1.24 mg/dL   Calcium  10.1 8.9 - 10.3 mg/dL   Total Protein 8.1 6.5 - 8.1 g/dL   Albumin 4.9 3.5 - 5.0 g/dL   AST 19 15 - 41 U/L   ALT 24 0 - 44 U/L   Alkaline Phosphatase 93 38 - 126 U/L   Total Bilirubin 2.9 (H) 0.0 - 1.2 mg/dL   GFR, Estimated 45 (L) >60 mL/min   Anion gap 28 (H) 5 - 15  Lipase, blood     Status: Abnormal   Collection Time: 12/13/23 12:23 PM  Result Value Ref Range   Lipase 164 (H) 11 - 51 U/L  Troponin I (High Sensitivity)     Status: None   Collection Time: 12/13/23 12:23 PM  Result Value Ref Range   Troponin I (High Sensitivity) 6 <18 ng/L  POC CBG, ED     Status: Abnormal   Collection Time: 12/13/23 12:25 PM  Result Value Ref Range   Glucose-Capillary 197 (H) 70 - 99 mg/dL  I-stat chem 8, ED (not at [facility], [facility] or [facility])     Status: Abnormal   Collection Time: 12/13/23 12:41 PM  Result  Value Ref Range   Sodium 139 135 - 145 mmol/L   Potassium 4.6 3.5 - 5.1 mmol/L   Chloride 108 98 - 111 mmol/L   BUN 50 (H) 8 - 23 mg/dL   Creatinine, Ser 8.99 0.61 - 1.24 mg/dL   Glucose, Bld 809 (H) 70 - 99 mg/dL   Calcium , Ion 1.22 1.15 - 1.40 mmol/L   TCO2 13 (L) 22 - 32 mmol/L   Hemoglobin 18.7 (H) 13.0 - 17.0 g/dL   HCT 44.9 (H) 60.9 - 47.9 %  I-Stat CG4 Lactic Acid     Status: Abnormal   Collection Time: 12/13/23  4:42 PM  Result Value Ref Range   Lactic Acid, Venous 2.8 (HH) 0.5 - 1.9 mmol/L   Comment NOTIFIED PHYSICIAN      CT Angio Chest/Abd/Pel for Dissection W and/or Wo Contrast Result Date: 12/13/2023 EXAM: CTA CHEST, ABDOMEN AND PELVIS WITH AND WITHOUT CONTRAST 12/13/2023 02:14:14 PM TECHNIQUE: CTA of the chest was performed with and without the administration of intravenous contrast. CTA of the abdomen and pelvis was performed with the administration of intravenous contrast. 100 mL of iohexol  (OMNIPAQUE ) 350 MG/ML injection was administered. Multiplanar reformatted images are provided for review. MIP images are provided for review. Automated exposure control, iterative reconstruction, and/or weight based adjustment of the mA/kV was utilized to reduce the radiation dose to as low as reasonably achievable. COMPARISON: 04/11/2023 CLINICAL HISTORY: Acute aortic syndrome (AAS) suspected; concern for food bolus. FINDINGS: VASCULATURE: AORTA: Scattered descending thoracic aortic calcifications. No acute finding. No abdominal aortic aneurysm. No dissection. PULMONARY ARTERIES: No pulmonary embolism with the limits of this exam. GREAT VESSELS OF AORTIC ARCH: No acute finding. No dissection. No arterial occlusion or significant stenosis. CELIAC TRUNK: No acute finding. No occlusion or significant stenosis. SUPERIOR MESENTERIC ARTERY: No acute finding. No occlusion or significant stenosis. INFERIOR MESENTERIC ARTERY: No acute finding. No occlusion or significant stenosis. RENAL ARTERIES:  Duplicated left renal arteries, superior dominant, both patent. No occlusion or significant stenosis. ILIAC ARTERIES: Bilateral pelvic vascular calcifications. moderate aortoiliac atheromatous plaque without aneurysm or high grade stenosis. No occlusion  or significant stenosis. CHEST: MEDIASTINUM: Common ostium for left pulmonary veins, an anatomic variant. Scattered right coronary calcifications. No mediastinal lymphadenopathy. The heart and pericardium demonstrate no acute abnormality. LUNGS AND PLEURA: Breathing motion somewhat degrades pulmonary parenchymal evaluation. The lungs are without acute process. No focal consolidation or pulmonary edema. No evidence of pleural effusion or pneumothorax. THORACIC BONES AND SOFT TISSUES: vertebral endplate spurring at multiple levels in the lower thoracic spine. No acute soft tissue abnormality. ABDOMEN AND PELVIS: LIVER: The liver is unremarkable. GALLBLADDER AND BILE DUCTS: Gallbladder is unremarkable. No biliary ductal dilatation. SPLEEN: The spleen is unremarkable. PANCREAS: The pancreas is unremarkable. ADRENAL GLANDS: Bilateral adrenal glands demonstrate no acute abnormality. KIDNEYS, URETERS AND BLADDER: No stones in the kidneys or ureters. No hydronephrosis. No perinephric or periureteral stranding. Urinary bladder is unremarkable. GI AND BOWEL: The mid and distal esophagus is mildly distended. Stomach and duodenal sweep demonstrate no acute abnormality. There is no bowel obstruction. No abnormal bowel wall thickening or distension. normal appendix. REPRODUCTIVE: Moderate prostate enlargement. PERITONEUM AND RETROPERITONEUM: No ascites or free air. LYMPH NODES: No lymphadenopathy. ABDOMINAL BONES AND SOFT TISSUES: lumbar spondylotic change most severe L5-S1. Mild bilateral hip DJD. No acute soft tissue abnormality. IMPRESSION: 1. No acute aortic syndrome. 2. Mildly distended mid and distal esophagus, which may reflect an esophageal food bolus impaction.  Electronically signed by: Dayne Hassell MD 12/13/2023 02:37 PM EST RP Workstation: [id number]    ROS:  As stated above in the HPI otherwise negative.  Blood pressure 134/81, pulse (!) 112, temperature 97.8 F (36.6 C), temperature source Oral, resp. rate 15, height 5' 11 (1.803 m), weight 80.3 kg, SpO2 98%.    PE: Gen: NAD, Alert and Oriented, uncomfortable HEENT:  [place]/AT, EOMI Neck: Supple, no LAD Lungs: CTA Bilaterally CV: RRR without M/G/R ABD: Soft, epigastric pain, +BS Ext: No C/C/E  Assessment/Plan: 1) Food impaction. 2) History of intermittent dysphagia. 3) GERD.   Even though he was not able to tolerate any liquids, he is not in any critical condition.  His BUN is elevated, but the creatinine is normal.  He is dehydrated as his HCT is elevated, but he seems to be responding quite well to IV fluids.  The patient is not in any acute distress, but he is uncomfortable with epigastric pain.  His vital signs are stable.  There was no evidence of hypotension.  Plan: 1) EGD for disimpaction. 1   Khalea Ventura D 12/13/2023, 5:17 PM

## 2023-12-13 NOTE — H&P (Addendum)
 History and Physical    Patient: Christopher Kirk DOB: Nov 13, 1950 DOA: 12/13/2023 DOS: the patient was seen and examined on 12/13/2023 . PCP: Avva, Ravisankar, MD  Patient coming from: Home Chief complaint: Chief Complaint  Patient presents with   Nausea   Emesis   HPI:  Christopher Kirk is a 73 y.o. male with past medical history  of tobacco abuse,History of CVA, diabetes mellitus type 2, hyperlipidemia, GERD comes today with nausea vomiting abdominal pain.  Patient reports history of dysphagia and food impaction symptoms since the past 50 years now reporting weight loss and since Thursday has had symptoms of upper epigastric to upper abdominal pain after eating chicken and states that usually when his food gets stuck he is able to vomit it out or eventually it goes down but this time it is worse and is not able to keep any clear liquids or anything down and the pain is severe.  GI at bedside.  Patient has never had an upper GI evaluation or endoscopy or colonoscopy.  ED Course:  Vital signs in the ED were notable for the following:  Vitals:   12/13/23 1222 12/13/23 1630 12/13/23 1818 12/13/23 1819  BP:  134/81  132/78  Pulse:  (!) 112  (!) 106  Temp:   Comment: CRNA 98.1 F (36.7 C)  Resp:  15  18  Height: 5' 11 (1.803 m)     Weight: 80.3 kg     SpO2:  98%  100%  TempSrc:      BMI (Calculated): 24.7      >>ED evaluation thus far shows: CBC with leukocytosis of 23.1 hemoglobin of 17.6 normal platelets 368.  CMP shows acidosis with an anion gap of 28 bicarb of 12 glucose 192 AKI with BUN of 39 creatinine of 1.60 lipase 164 total bili 2.9 eGFR 45. EKG sinus rhythm at 100 PR 152 QTc 456.  >>While in the ED patient received the following: Medications  dextrose 5% lactated ringers bolus 1,000 mL (has no administration in time range)  iohexol  (OMNIPAQUE ) 350 MG/ML injection 100 mL (100 mLs Intravenous Contrast Given 12/13/23 1414)   Review of Systems  Cardiovascular:   Positive for chest pain.   Past Medical History:  Diagnosis Date   CVA (cerebral vascular accident) (HCC) 04/07/2020   Diabetes mellitus without complication (HCC)    Dyslipidemia    GERD (gastroesophageal reflux disease)    Hypertension    Past Surgical History:  Procedure Laterality Date   BACK SURGERY      reports that he quit smoking about 38 years ago. His smoking use included cigarettes. He started smoking about 58 years ago. He has a 60 pack-year smoking history. He has never used smokeless tobacco. He reports current alcohol  use. He reports that he does not use drugs. No Known Allergies Family History  Problem Relation Age of Onset   Stroke Father    Stroke Sister    Stroke Brother        x 3 brothers   Prior to Admission medications   Medication Sig Start Date End Date Taking? Authorizing Provider  amLODipine  (NORVASC ) 10 MG tablet Take 1 tablet (10 mg total) by mouth daily. Patient taking differently: Take 5 mg by mouth daily. 1/2 tab daily 04/09/20   Perri DELENA Meliton Mickey., MD  atorvastatin  (LIPITOR ) 80 MG tablet TAKE 1 TABLET BY MOUTH EVERY DAY 01/28/23   Whitfield Raisin, NP  carvedilol  (COREG ) 6.25 MG tablet Take 1 tablet (6.25 mg  total) by mouth 2 (two) times daily. 04/10/20   Perri DELENA Meliton Mickey., MD  clopidogrel  (PLAVIX ) 75 MG tablet Take 1 tablet (75 mg total) by mouth daily. 05/10/20   Whitfield Raisin, NP  irbesartan  (AVAPRO ) 300 MG tablet Take 1 tablet (300 mg total) by mouth daily. 04/10/20   Perri DELENA Meliton Mickey., MD  metFORMIN (GLUCOPHAGE) 1000 MG tablet Take 500 mg by mouth 2 (two) times daily. 1/2 tab daily 02/29/20   [provider]  pantoprazole (PROTONIX) 20 MG tablet Take 20 mg by mouth daily.    [provider]  topiramate  (TOPAMAX ) 25 MG tablet Take 1 tablet (25 mg total) by mouth at bedtime. 01/07/22   Whitfield Raisin, NP  traMADol  (ULTRAM ) 50 MG tablet Take 1 tablet (50 mg total) by mouth every 6 (six) hours as needed. 04/12/23   Jarold Olam HERO, PA-C                                                                                 Vitals:   12/13/23 1207 12/13/23 1222 12/13/23 1630 12/13/23 1819  BP: 138/77  134/81 132/78  Pulse: (!) 105  (!) 112 (!) 106  Resp: 18  15 18   Temp: 97.8 F (36.6 C)   98.1 F (36.7 C)  TempSrc: Oral     SpO2: 100%  98% 100%  Weight:  80.3 kg    Height:  5' 11 (1.803 m)     Physical Exam Vitals reviewed.  Constitutional:      General: He is not in acute distress.    Appearance: He is not ill-appearing.  HENT:     Head: Normocephalic.     Mouth/Throat:     Mouth: Mucous membranes are dry.  Eyes:     Extraocular Movements: Extraocular movements intact.  Cardiovascular:     Rate and Rhythm: Normal rate and regular rhythm.     Heart sounds: Normal heart sounds.  Pulmonary:     Breath sounds: Normal breath sounds.  Abdominal:     General: There is no distension.     Palpations: Abdomen is soft.     Tenderness: There is no abdominal tenderness.  Neurological:     General: No focal deficit present.     Mental Status: He is alert and oriented to person, place, and time.     Labs on Admission: I have personally reviewed following labs and imaging studies CBC: Recent Labs  Lab 12/13/23 1223 12/13/23 1241  WBC 23.1*  --   NEUTROABS 19.2*  --   HGB 17.6* 18.7*  HCT 54.8* 55.0*  MCV 95.5  --   PLT 368  --    Basic Metabolic Panel: Recent Labs  Lab 12/13/23 1223 12/13/23 1241  NA 140 139  K 4.5 4.6  CL 100 108  CO2 12*  --   GLUCOSE 192* 190*  BUN 39* 50*  CREATININE 1.62* 1.00  CALCIUM  10.1  --    GFR: Estimated Creatinine Clearance: 71.1 mL/min (by C-G formula based on SCr of 1 mg/dL). Liver Function Tests: Recent Labs  Lab 12/13/23 1223  AST 19  ALT 24  ALKPHOS 93  BILITOT 2.9*  PROT  8.1  ALBUMIN 4.9   Recent Labs  Lab 12/13/23 1223  LIPASE 164*   No results for input(s): AMMONIA in the last 168 hours. Recent Labs    04/11/23 1746  12/13/23 1223 12/13/23 1241  BUN 14 39* 50*  CREATININE 0.74 1.62* 1.00    Cardiac Enzymes: Recent Labs  Lab 12/13/23 1632  CKTOTAL 47*   BNP (last 3 results) No results for input(s): PROBNP in the last 8760 hours. HbA1C: No results for input(s): HGBA1C in the last 72 hours. CBG: Recent Labs  Lab 12/13/23 1225 12/13/23 1821 12/13/23 1912  GLUCAP 197* 285* 199*   Lipid Profile: No results for input(s): CHOL, HDL, LDLCALC, TRIG, CHOLHDL, LDLDIRECT in the last 72 hours. Thyroid  Function Tests: No results for input(s): TSH, T4TOTAL, FREET4, T3FREE, THYROIDAB in the last 72 hours. Anemia Panel: No results for input(s): VITAMINB12, FOLATE, FERRITIN, TIBC, IRON, RETICCTPCT in the last 72 hours. Urine analysis:    Component Value Date/Time   COLORURINE YELLOW 04/07/2020 1315   APPEARANCEUR CLEAR 04/07/2020 1315   LABSPEC >1.046 (H) 04/07/2020 1315   PHURINE 5.0 04/07/2020 1315   GLUCOSEU 50 (A) 04/07/2020 1315   HGBUR SMALL (A) 04/07/2020 1315   BILIRUBINUR NEGATIVE 04/07/2020 1315   KETONESUR NEGATIVE 04/07/2020 1315   PROTEINUR NEGATIVE 04/07/2020 1315   NITRITE NEGATIVE 04/07/2020 1315   LEUKOCYTESUR NEGATIVE 04/07/2020 1315   Radiological Exams on Admission: CT Angio Chest/Abd/Pel for Dissection W and/or Wo Contrast Result Date: 12/13/2023 EXAM: CTA CHEST, ABDOMEN AND PELVIS WITH AND WITHOUT CONTRAST 12/13/2023 02:14:14 PM TECHNIQUE: CTA of the chest was performed with and without the administration of intravenous contrast. CTA of the abdomen and pelvis was performed with the administration of intravenous contrast. 100 mL of iohexol  (OMNIPAQUE ) 350 MG/ML injection was administered. Multiplanar reformatted images are provided for review. MIP images are provided for review. Automated exposure control, iterative reconstruction, and/or weight based adjustment of the mA/kV was utilized to reduce the radiation dose to as low as reasonably  achievable. COMPARISON: 04/11/2023 CLINICAL HISTORY: Acute aortic syndrome (AAS) suspected; concern for food bolus. FINDINGS: VASCULATURE: AORTA: Scattered descending thoracic aortic calcifications. No acute finding. No abdominal aortic aneurysm. No dissection. PULMONARY ARTERIES: No pulmonary embolism with the limits of this exam. GREAT VESSELS OF AORTIC ARCH: No acute finding. No dissection. No arterial occlusion or significant stenosis. CELIAC TRUNK: No acute finding. No occlusion or significant stenosis. SUPERIOR MESENTERIC ARTERY: No acute finding. No occlusion or significant stenosis. INFERIOR MESENTERIC ARTERY: No acute finding. No occlusion or significant stenosis. RENAL ARTERIES: Duplicated left renal arteries, superior dominant, both patent. No occlusion or significant stenosis. ILIAC ARTERIES: Bilateral pelvic vascular calcifications. moderate aortoiliac atheromatous plaque without aneurysm or high grade stenosis. No occlusion or significant stenosis. CHEST: MEDIASTINUM: Common ostium for left pulmonary veins, an anatomic variant. Scattered right coronary calcifications. No mediastinal lymphadenopathy. The heart and pericardium demonstrate no acute abnormality. LUNGS AND PLEURA: Breathing motion somewhat degrades pulmonary parenchymal evaluation. The lungs are without acute process. No focal consolidation or pulmonary edema. No evidence of pleural effusion or pneumothorax. THORACIC BONES AND SOFT TISSUES: vertebral endplate spurring at multiple levels in the lower thoracic spine. No acute soft tissue abnormality. ABDOMEN AND PELVIS: LIVER: The liver is unremarkable. GALLBLADDER AND BILE DUCTS: Gallbladder is unremarkable. No biliary ductal dilatation. SPLEEN: The spleen is unremarkable. PANCREAS: The pancreas is unremarkable. ADRENAL GLANDS: Bilateral adrenal glands demonstrate no acute abnormality. KIDNEYS, URETERS AND BLADDER: No stones in the kidneys or ureters. No hydronephrosis. No perinephric or  periureteral stranding. Urinary bladder is unremarkable. GI AND BOWEL: The mid and distal esophagus is mildly distended. Stomach and duodenal sweep demonstrate no acute abnormality. There is no bowel obstruction. No abnormal bowel wall thickening or distension. normal appendix. REPRODUCTIVE: Moderate prostate enlargement. PERITONEUM AND RETROPERITONEUM: No ascites or free air. LYMPH NODES: No lymphadenopathy. ABDOMINAL BONES AND SOFT TISSUES: lumbar spondylotic change most severe L5-S1. Mild bilateral hip DJD. No acute soft tissue abnormality. IMPRESSION: 1. No acute aortic syndrome. 2. Mildly distended mid and distal esophagus, which may reflect an esophageal food bolus impaction. Electronically signed by: Dayne Hassell MD 12/13/2023 02:37 PM EST RP Workstation: [id number]   Data Reviewed: Relevant notes from primary care and specialist visits, past discharge summaries as available in EHR, including Care Everywhere . Prior diagnostic testing as pertinent to current admission diagnoses, Updated medications and problem lists for reconciliation .ED course, including vitals, labs, imaging, treatment and response to treatment,Triage notes, nursing and pharmacy notes and ED provider's notes.Notable results as noted in HPI.Discussed case with EDMD/ ED APP/ or Specialty MD on call and as needed.  Assessment & Plan   >> Food bolus impaction: N.p.o., gentle IV fluid hydration overnight. GI has been consulted. No endoscopies listed in the chart, patient does follow-up with [facility]. Clinically patient's findings are concerning and differentials of course include esophageal stenosis urgent egd showed - LA Grade D esophagitis with no bleeding. Biopsied. - Esophageal ulcer. Biopsied. - A large amount of food ( residue) in the stomach. - Normal examined duodenum.  >> Metabolic acidosis: Patient found to have elevated anion gap metabolic acidosis, lactic acid is ordered and pending.Will also order a beta  hydroxybutyrate acid and follow. -Lactic acidosis level of 2.8.  Attribute to dehydration.  Continue with aggressive IV fluid hydration.  >> Essential hypertension: Vitals:   12/13/23 1207 12/13/23 1630 12/13/23 1819  BP: 138/77 134/81 132/78  Hold PTA meds which include amlodipine , Coreg , Avapro . As needed hydralazine IV.   >> History of CVA: Antiplatelet therapy currently held keep patient n.p.o. until tomorrow.   >> Diabetes mellitus type 2: Glycemic protocol every 4 hours.  Diet to be advanced per GI recommendation.  >> AKI: Lab Results  Component Value Date   CREATININE 1.00 12/13/2023   CREATININE 1.62 (H) 12/13/2023   CREATININE 0.74 04/11/2023  Currently resolved patient received 1 L IV fluid in the ED and will continue with ongoing aggressive hydration and maintenance IV fluids overnight.    DVT prophylaxis:  Heparin Consults:  GI  Advance Care Planning:    Code Status: Full Code   Family Communication:  Wife at bedside Disposition Plan:  Home Severity of Illness: The appropriate patient status for this patient is INPATIENT. Inpatient status is judged to be reasonable and necessary in order to provide the required intensity of service to ensure the patient's safety. The patient's presenting symptoms, physical exam findings, and initial radiographic and laboratory data in the context of their chronic comorbidities is felt to place them at high risk for further clinical deterioration. Furthermore, it is not anticipated that the patient will be medically stable for discharge from the hospital within 2 midnights of admission.   * I certify that at the point of admission it is my clinical judgment that the patient will require inpatient hospital care spanning beyond 2 midnights from the point of admission due to high intensity of service, high risk for further deterioration and high frequency of surveillance required.*  Unresulted Labs (From admission, onward)  Start     Ordered   12/14/23 0500  Comprehensive metabolic panel  Tomorrow morning,   R        12/13/23 1646   12/14/23 0500  CBC  Tomorrow morning,   R        12/13/23 1646   12/14/23 0500  Magnesium  Tomorrow morning,   R        12/13/23 1646   12/13/23 1646  Magnesium  Add-on,   AD        12/13/23 1646   12/13/23 1644  Hemoglobin A1c  Once,   R       Comments: To assess prior glycemic control    12/13/23 1646   12/13/23 1644  CBC  (heparin)  Once,   R       Comments: Baseline for heparin therapy IF NOT ALREADY DRAWN.  Notify MD if PLT < 100 K.    12/13/23 1646   12/13/23 1644  Creatinine, serum  (heparin)  Once,   R       Comments: Baseline for heparin therapy IF NOT ALREADY DRAWN.    12/13/23 1646   12/13/23 1624  Blood culture (routine x 2)  BLOOD CULTURE X 2,   R      12/13/23 1623   12/13/23 1623  Beta-hydroxybutyric acid  Add-on,   AD        12/13/23 1623   12/13/23 1612  Osmolality  Add-on,   AD        12/13/23 1611   12/13/23 1223  Urinalysis, Routine w reflex microscopic -Urine, Clean Catch  Once,   URGENT       Question:  Specimen Source  Answer:  Urine, Clean Catch   12/13/23 1224            Meds ordered this encounter  Medications   iohexol  (OMNIPAQUE ) 350 MG/ML injection 100 mL   dextrose 5% lactated ringers bolus 1,000 mL   DISCONTD: 0.9 %  sodium chloride  infusion   ondansetron  (ZOFRAN ) injection 4 mg   fentaNYL  (SUBLIMAZE ) injection 50 mcg   heparin injection 5,000 Units   sodium chloride  flush (NS) 0.9 % injection 3 mL   DISCONTD: 0.9 %  sodium chloride  infusion   OR Linked Order Group    acetaminophen  (TYLENOL ) tablet 650 mg    acetaminophen  (TYLENOL ) suppository 650 mg   pantoprazole (PROTONIX) injection 40 mg   DISCONTD: 0.9 %  sodium chloride  infusion   OR Linked Order Group    oxyCODONE (Oxy IR/ROXICODONE) immediate release tablet 5 mg    oxyCODONE (ROXICODONE) 5 MG/5ML solution 5 mg   fentaNYL  (SUBLIMAZE ) injection 25-50 mcg    ondansetron  (ZOFRAN ) injection 4 mg   sodium chloride  0.9 % bolus 1,000 mL   0.9 %  sodium chloride  infusion   hydrALAZINE (APRESOLINE) injection 10 mg   insulin  aspart (novoLOG ) injection 0-9 Units    Correction coverage::   Sensitive (thin, NPO, renal)    CBG < 70::   Implement Hypoglycemia Standing Orders and refer to Hypoglycemia Standing Orders sidebar report    CBG 70 - 120::   0 units    CBG 121 - 150::   1 unit    CBG 151 - 200::   2 units    CBG 201 - 250::   3 units    CBG 251 - 300::   5 units    CBG 301 - 350::   7 units    CBG 351 - 400:  9 units    CBG > 400:   call MD and obtain STAT lab verification     Orders Placed This Encounter  Procedures   Procedural/ Surgical Case Request: EGD (ESOPHAGOGASTRODUODENOSCOPY)   UPPER ENDOSCOPY   Blood culture (routine x 2)   CT Angio Chest/Abd/Pel for Dissection W and/or Wo Contrast   CBC with Differential   Comprehensive metabolic panel   Urinalysis, Routine w reflex microscopic -Urine, Clean Catch   Lipase, blood   CK   Gamma GT   Osmolality   Beta-hydroxybutyric acid   Hemoglobin A1c   CBC   Creatinine, serum   Comprehensive metabolic panel   CBC   Magnesium   Magnesium   Glucose, capillary   Glucose, capillary   Diet NPO time specified   Vital signs   CBG on all patients with diabetes   Pre-admission testing diagnosis   Vital signs per Moderate Sedation protocol   Discharge patient to home when patient meets Aldrete score   Discharge instructions   Informed Consent Details: Physician/Practitioner Attestation; Transcribe to consent form and obtain patient signature   Bladder scan   Apply Diabetes Mellitus Care Plan   STAT CBG when hypoglycemia is suspected. If treated, recheck every 15 minutes after each treatment until CBG >/= 70 mg/dl   Refer to Hypoglycemia Protocol Sidebar Report for treatment of CBG < 70 mg/dl   Maintain IV access   Vital signs   Notify physician (specify)   Refer to Sidebar Report  Mobility Protocol for Adult Inpatient   Initiate Adult Central Line Maintenance and Catheter Clearance Protocol for patients with central line (CVC, PICC, Port, Hemodialysis, Trialysis)   Daily weights   Intake and Output   Initiate CHG Protocol for patients in ICU/SD or any patient with a central line or foley catheter   Do not place and if present remove PureWick   Initiate Oral Care Protocol   Initiate Carrier Fluid Protocol   RN may order General Admission PRN Orders utilizing General Admission PRN medications (through manage orders) for the following patient needs: allergy symptoms (Claritin), cold sores (Carmex), cough (Robitussin DM), eye irritation (Liquifilm Tears), hemorrhoids (Tucks), indigestion (Maalox), minor skin irritation (Hydrocortisone Cream), muscle pain (Ben Gay), nose irritation (saline nasal spray) and sore throat (Chloraseptic spray).   Cardiac Monitoring Continuous x 48 hours Indications for use: Other; Other indications for use: HTN.   Ambulate with assistance   Vital signs   CBG on all patients with diabetes   Pre-admission testing diagnosis   Vital signs per Moderate Sedation protocol   Discharge patient to home when patient meets Aldrete score   Discharge instructions   Informed Consent Details: Physician/Practitioner Attestation; Transcribe to consent form and obtain patient signature   Discharge  per PACU criteria   Vital signs   Cardiac monitoring   Notify physician (specify)   May continue current IVF if bag empty and / or patient is nauseated and no further IV orders are written   Apply Diabetes Mellitus Care Plan   STAT CBG when hypoglycemia is suspected. If treated, recheck every 15 minutes after each treatment until CBG >/= 70 mg/dl   Refer to Hypoglycemia Protocol Sidebar Report for treatment of CBG < 70 mg/dl   Full code   Consult to hospitalist   Oxygen therapy Mode or (Route): Nasal cannula; Keep O2 saturation between: 90% or greater   Pulse  oximetry check with vital signs   Oxygen therapy Mode or (Route): Nasal cannula; Liters Per  Minute: 2; Keep O2 saturation between: greater than 92 %   Oxygen therapy Mode or (Route): Nasal cannula; Keep O2 saturation between: 90% or greater   Pulse oximetry, continuous   Oxygen therapy Mode or (Route): Nasal cannula   POC CBG, ED   I-stat chem 8, ED (not at [facility], [facility] or [facility])   I-Stat CG4 Lactic Acid   ED EKG   Insert and maintain IV line   Insert and maintain IV line   Admit to Inpatient (patient's expected length of stay will be greater than 2 midnights or inpatient only procedure)   Aspiration precautions   Fall precautions    Author: Mario LULLA Blanch, MD 12 pm- 8 pm. [facility]. 12/13/2023 7:18 PM Please note for any communication after hours contact [facility] Assigned provider on call on Amion.

## 2023-12-13 NOTE — ED Notes (Signed)
 Paged GI per verbal order of PA [facility]

## 2023-12-14 ENCOUNTER — Encounter (HOSPITAL_COMMUNITY)
Admission: EM | Disposition: A | Discharge status: Home / Self Care | Payer: Self-pay | Source: Home / Self Care | Attending: Internal Medicine

## 2023-12-14 ENCOUNTER — Encounter (HOSPITAL_COMMUNITY): Payer: Self-pay | Admitting: Gastroenterology

## 2023-12-14 DIAGNOSIS — I10 Essential (primary) hypertension: Secondary | ICD-10-CM

## 2023-12-14 DIAGNOSIS — I639 Cerebral infarction, unspecified: Secondary | ICD-10-CM

## 2023-12-14 DIAGNOSIS — R1310 Dysphagia, unspecified: Secondary | ICD-10-CM

## 2023-12-14 DIAGNOSIS — E785 Hyperlipidemia, unspecified: Secondary | ICD-10-CM

## 2023-12-14 DIAGNOSIS — T18128A Food in esophagus causing other injury, initial encounter: Secondary | ICD-10-CM | POA: Diagnosis not present

## 2023-12-14 DIAGNOSIS — N179 Acute kidney failure, unspecified: Secondary | ICD-10-CM

## 2023-12-14 LAB — URINALYSIS, ROUTINE W REFLEX MICROSCOPIC
Bacteria, UA: NONE SEEN
Bilirubin Urine: NEGATIVE
Glucose, UA: >=500 mg/dL — AB
Hgb urine dipstick: NEGATIVE
Ketones, ur: 80 mg/dL — AB
Leukocytes,Ua: NEGATIVE
Nitrite: NEGATIVE
Protein, ur: NEGATIVE mg/dL
Specific Gravity, Urine: 1.018 (ref 1.005–1.030)
pH: 5 (ref 5.0–8.0)

## 2023-12-14 LAB — CBC
HCT: 48.9 % (ref 39.0–52.0)
Hemoglobin: 15.9 g/dL (ref 13.0–17.0)
MCH: 30.6 pg (ref 26.0–34.0)
MCHC: 32.5 g/dL (ref 30.0–36.0)
MCV: 94.2 fL (ref 80.0–100.0)
Platelets: 289 K/uL (ref 150–400)
RBC: 5.19 MIL/uL (ref 4.22–5.81)
RDW: 13.6 % (ref 11.5–15.5)
WBC: 20.4 K/uL — ABNORMAL HIGH (ref 4.0–10.5)
nRBC: 0 % (ref 0.0–0.2)

## 2023-12-14 LAB — MAGNESIUM: Magnesium: 2.3 mg/dL (ref 1.7–2.4)

## 2023-12-14 LAB — GLUCOSE, CAPILLARY
Glucose-Capillary: 159 mg/dL — ABNORMAL HIGH (ref 70–99)
Glucose-Capillary: 135 mg/dL — ABNORMAL HIGH (ref 70–99)
Glucose-Capillary: 166 mg/dL — ABNORMAL HIGH (ref 70–99)
Glucose-Capillary: 140 mg/dL — ABNORMAL HIGH (ref 70–99)
Glucose-Capillary: 146 mg/dL — ABNORMAL HIGH (ref 70–99)

## 2023-12-14 LAB — COMPREHENSIVE METABOLIC PANEL WITH GFR
ALT: 19 U/L (ref 0–44)
AST: 12 U/L — ABNORMAL LOW (ref 15–41)
Albumin: 4 g/dL (ref 3.5–5.0)
Alkaline Phosphatase: 74 U/L (ref 38–126)
Anion gap: 18 — ABNORMAL HIGH (ref 5–15)
BUN: 32 mg/dL — ABNORMAL HIGH (ref 8–23)
CO2: 13 mmol/L — ABNORMAL LOW (ref 22–32)
Calcium: 9.2 mg/dL (ref 8.9–10.3)
Chloride: 111 mmol/L (ref 98–111)
Creatinine, Ser: 1.37 mg/dL — ABNORMAL HIGH (ref 0.61–1.24)
GFR, Estimated: 55 mL/min — ABNORMAL LOW (ref >60)
Glucose, Bld: 152 mg/dL — ABNORMAL HIGH (ref 70–99)
Potassium: 4.2 mmol/L (ref 3.5–5.1)
Sodium: 142 mmol/L (ref 135–145)
Total Bilirubin: 1.7 mg/dL — ABNORMAL HIGH (ref 0.0–1.2)
Total Protein: 7 g/dL (ref 6.5–8.1)

## 2023-12-14 LAB — BETA-HYDROXYBUTYRIC ACID: Beta-Hydroxybutyric Acid: 6.35 mmol/L — ABNORMAL HIGH (ref 0.05–0.27)

## 2023-12-14 LAB — OSMOLALITY: Osmolality: 322 mosm/kg (ref 275–295)

## 2023-12-14 LAB — CREATININE, SERUM
Creatinine, Ser: 1.3 mg/dL — ABNORMAL HIGH (ref 0.61–1.24)
GFR, Estimated: 58 mL/min — ABNORMAL LOW (ref >60)

## 2023-12-14 LAB — HEMOGLOBIN A1C
Hgb A1c MFr Bld: 6.2 % — ABNORMAL HIGH (ref 4.8–5.6)
Mean Plasma Glucose: 131.24 mg/dL

## 2023-12-14 SURGERY — EGD (ESOPHAGOGASTRODUODENOSCOPY)
Anesthesia: Monitor Anesthesia Care

## 2023-12-14 NOTE — Progress Notes (Signed)
 [facility]                                                                               Christopher Kirk, is a 73 y.o. male, DOB - 08/13/1950, FMW:[id number] Admit date - 12/13/2023    Outpatient Primary MD for the patient is Avva, Ravisankar, MD  LOS - 1  days    Brief summary   Christopher Kirk is a 73 y.o. male with past medical history  of tobacco abuse,History of CVA, diabetes mellitus type 2, hyperlipidemia, GERD comes today with nausea vomiting abdominal pain.  Patient reports history of dysphagia and food impaction symptoms since the past 50 years now reporting weight loss and since Thursday has had symptoms of upper epigastric to upper abdominal pain after eating chicken and states that usually when his food gets stuck he is able to vomit it out or eventually it goes down but this time it is worse and is not able to keep any clear liquids or anything down and the pain is severe.  GI consulted, and he underwent EGD showing   LA Grade D (one or more mucosal breaks involving at least 75% of       esophageal circumference) esophagitis with no bleeding was found in the       entire esophagus. Biopsies were taken with a cold forceps for histology.      One cratered esophageal ulcer was found at the gastroesophageal       junction. Biopsies were taken with a cold forceps for histology.   Assessment & Plan   Dysphagia  S/p EGD showing esophagitis, and esophageal ulcer at GE junction.  Continue with IV ppi.  Pt not able to tolerate solids yet. Continue with clears for now.  Advance slowly as tolerated.     Type 2 DM with hyperglycemia CBG (last 3)  Recent Labs    12/14/23 0437 12/14/23 0728 12/14/23 1132  GLUCAP 159* 135* 166*   Resume SSI.    Hyperlipidemia On stain, on hold for now.    H/o CVA On stain and plavix   to resume on discharge.     Hypertension:  Well controlled. Holding for now.   AKI Creatinine at baseline around 1.  Creatinine around  1.3.  Continue with IV fluids.  Avoid hypotension, and nephrotoxic agents.    Leukocytosis Suspect reactive.  Repeat labs in am.   Estimated body mass index is 23.31 kg/m as calculated from the following:   Height as of this encounter: 5' 11 (1.803 m).   Weight as of this encounter: 75.8 kg.  Code Status: full code.  DVT Prophylaxis:  heparin injection 5,000 Units Start: 12/13/23 1700   Level of Care: Level of care: Telemetry Family Communication: none at bedside.   Disposition Plan:     Remains inpatient appropriate:  pending   Procedures:  EGD.  Consultants:   GI consult.   Antimicrobials:   Anti-infectives (From admission, onward)    None        Medications  Scheduled Meds:  heparin  5,000 Units Subcutaneous Q8H   insulin  aspart  0-9 Units Subcutaneous Q4H   pantoprazole (PROTONIX) IV  40  mg Intravenous Q12H   sodium chloride  flush  3 mL Intravenous Q12H   Continuous Infusions:  sodium chloride  125 mL/hr at 12/14/23 0408   sodium chloride      PRN Meds:.acetaminophen  **OR** acetaminophen , hydrALAZINE    Subjective:   Christopher Kirk was seen and examined today.  Still hurts when swallowing but better than yesterday.   Objective:   Vitals:   12/14/23 0002 12/14/23 0439 12/14/23 0453 12/14/23 0733  BP: 130/70 99/81  (!) 141/69  Pulse:    96  Resp: 16 18  18   Temp: 98.3 F (36.8 C) 98.6 F (37 C)  97.8 F (36.6 C)  TempSrc:      SpO2: 97% 100%  100%  Weight:   75.8 kg   Height:        Intake/Output Summary (Last 24 hours) at 12/14/2023 1501 Last data filed at 12/14/2023 1303 Gross per 24 hour  Intake 2299.17 ml  Output 1600 ml  Net 699.17 ml   Filed Weights   12/13/23 1222 12/14/23 0453  Weight: 80.3 kg 75.8 kg     Exam General: Alert and oriented x 3, NAD Cardiovascular: S1 S2 auscultated, no murmurs, RRR Respiratory: Clear to auscultation bilaterally, no wheezing, rales or rhonchi Gastrointestinal: Soft, nontender,  nondistended, + bowel sounds Ext: no pedal edema bilaterally Neuro: AAOx3,   Data Reviewed:  I have personally reviewed following labs and imaging studies   CBC Lab Results  Component Value Date   WBC 20.4 (H) 12/14/2023   RBC 5.19 12/14/2023   HGB 15.9 12/14/2023   HCT 48.9 12/14/2023   MCV 94.2 12/14/2023   MCH 30.6 12/14/2023   PLT 289 12/14/2023   MCHC 32.5 12/14/2023   RDW 13.6 12/14/2023   LYMPHSABS 2.0 12/13/2023   MONOABS 1.6 (H) 12/13/2023   EOSABS 0.0 12/13/2023   BASOSABS 0.1 12/13/2023     Last metabolic panel Lab Results  Component Value Date   NA 142 12/14/2023   K 4.2 12/14/2023   CL 111 12/14/2023   CO2 13 (L) 12/14/2023   BUN 32 (H) 12/14/2023   CREATININE 1.30 (H) 12/14/2023   CREATININE 1.37 (H) 12/14/2023   GLUCOSE 152 (H) 12/14/2023   GFRNONAA 58 (L) 12/14/2023   GFRNONAA 55 (L) 12/14/2023   CALCIUM  9.2 12/14/2023   PROT 7.0 12/14/2023   ALBUMIN 4.0 12/14/2023   BILITOT 1.7 (H) 12/14/2023   ALKPHOS 74 12/14/2023   AST 12 (L) 12/14/2023   ALT 19 12/14/2023   ANIONGAP 18 (H) 12/14/2023    CBG (last 3)  Recent Labs    12/14/23 0437 12/14/23 0728 12/14/23 1132  GLUCAP 159* 135* 166*      Coagulation Profile: No results for input(s): INR, PROTIME in the last 168 hours.   Radiology Studies: CT Angio Chest/Abd/Pel for Dissection W and/or Wo Contrast Result Date: 12/13/2023 EXAM: CTA CHEST, ABDOMEN AND PELVIS WITH AND WITHOUT CONTRAST 12/13/2023 02:14:14 PM TECHNIQUE: CTA of the chest was performed with and without the administration of intravenous contrast. CTA of the abdomen and pelvis was performed with the administration of intravenous contrast. 100 mL of iohexol  (OMNIPAQUE ) 350 MG/ML injection was administered. Multiplanar reformatted images are provided for review. MIP images are provided for review. Automated exposure control, iterative reconstruction, and/or weight based adjustment of the mA/kV was utilized to reduce the  radiation dose to as low as reasonably achievable. COMPARISON: 04/11/2023 CLINICAL HISTORY: Acute aortic syndrome (AAS) suspected; concern for food bolus. FINDINGS: VASCULATURE: AORTA: Scattered descending thoracic aortic calcifications.  No acute finding. No abdominal aortic aneurysm. No dissection. PULMONARY ARTERIES: No pulmonary embolism with the limits of this exam. GREAT VESSELS OF AORTIC ARCH: No acute finding. No dissection. No arterial occlusion or significant stenosis. CELIAC TRUNK: No acute finding. No occlusion or significant stenosis. SUPERIOR MESENTERIC ARTERY: No acute finding. No occlusion or significant stenosis. INFERIOR MESENTERIC ARTERY: No acute finding. No occlusion or significant stenosis. RENAL ARTERIES: Duplicated left renal arteries, superior dominant, both patent. No occlusion or significant stenosis. ILIAC ARTERIES: Bilateral pelvic vascular calcifications. moderate aortoiliac atheromatous plaque without aneurysm or high grade stenosis. No occlusion or significant stenosis. CHEST: MEDIASTINUM: Common ostium for left pulmonary veins, an anatomic variant. Scattered right coronary calcifications. No mediastinal lymphadenopathy. The heart and pericardium demonstrate no acute abnormality. LUNGS AND PLEURA: Breathing motion somewhat degrades pulmonary parenchymal evaluation. The lungs are without acute process. No focal consolidation or pulmonary edema. No evidence of pleural effusion or pneumothorax. THORACIC BONES AND SOFT TISSUES: vertebral endplate spurring at multiple levels in the lower thoracic spine. No acute soft tissue abnormality. ABDOMEN AND PELVIS: LIVER: The liver is unremarkable. GALLBLADDER AND BILE DUCTS: Gallbladder is unremarkable. No biliary ductal dilatation. SPLEEN: The spleen is unremarkable. PANCREAS: The pancreas is unremarkable. ADRENAL GLANDS: Bilateral adrenal glands demonstrate no acute abnormality. KIDNEYS, URETERS AND BLADDER: No stones in the kidneys or ureters.  No hydronephrosis. No perinephric or periureteral stranding. Urinary bladder is unremarkable. GI AND BOWEL: The mid and distal esophagus is mildly distended. Stomach and duodenal sweep demonstrate no acute abnormality. There is no bowel obstruction. No abnormal bowel wall thickening or distension. normal appendix. REPRODUCTIVE: Moderate prostate enlargement. PERITONEUM AND RETROPERITONEUM: No ascites or free air. LYMPH NODES: No lymphadenopathy. ABDOMINAL BONES AND SOFT TISSUES: lumbar spondylotic change most severe L5-S1. Mild bilateral hip DJD. No acute soft tissue abnormality. IMPRESSION: 1. No acute aortic syndrome. 2. Mildly distended mid and distal esophagus, which may reflect an esophageal food bolus impaction. Electronically signed by: Katheleen Faes MD 12/13/2023 02:37 PM EST RP Workstation: [id number]       Elgie Butter M.D. [facility] 12/14/2023, 3:01 PM  Available via Epic secure chat 7am-7pm After 7 pm, please refer to night coverage provider listed on amion.

## 2023-12-14 NOTE — Progress Notes (Signed)
 Patient has not been able to tolerate his breakfast nor much if any of his lunch he stated that it  feels like knives whenever he swallows only thing that semi feels okay is applesauce or jello at the moment.

## 2023-12-14 NOTE — Plan of Care (Signed)

## 2023-12-14 NOTE — Progress Notes (Signed)
 Subjective: Feeling much better.  He drank water last evening, even though he was NPO.  No further problems with nausea, vomiting, or dysphagia.  Objective: Vital signs in last 24 hours: Temp:  [97.8 F (36.6 C)-98.6 F (37 C)] 97.8 F (36.6 C) (11/16 0733) Pulse Rate:  [81-112] 96 (11/16 0733) Resp:  [15-18] 18 (11/16 0733) BP: (99-152)/(66-81) 141/69 (11/16 0733) SpO2:  [96 %-100 %] 100 % (11/16 0733) Weight:  [75.8 kg-80.3 kg] 75.8 kg (11/16 0453)    Intake/Output from previous day: 11/15 0701 - 11/16 0700 In: 2179.2 [I.V.:1179.2; IV Piggyback:1000] Out: 400 [Urine:400] Intake/Output this shift: No intake/output data recorded.  General appearance: alert and no distress GI: soft, non-tender; bowel sounds normal; no masses,  no organomegaly  Lab Results: Recent Labs    12/13/23 1223 12/13/23 1241 12/14/23 0109  WBC 23.1*  --  20.4*  HGB 17.6* 18.7* 15.9  HCT 54.8* 55.0* 48.9  PLT 368  --  289   BMET Recent Labs    12/13/23 1223 12/13/23 1241 12/14/23 0109  NA 140 139 142  K 4.5 4.6 4.2  CL 100 108 111  CO2 12*  --  13*  GLUCOSE 192* 190* 152*  BUN 39* 50* 32*  CREATININE 1.62* 1.00 1.30*  1.37*  CALCIUM  10.1  --  9.2   LFT Recent Labs    12/14/23 0109  PROT 7.0  ALBUMIN 4.0  AST 12*  ALT 19  ALKPHOS 74  BILITOT 1.7*   PT/INR No results for input(s): LABPROT, INR in the last 72 hours. Hepatitis Panel No results for input(s): HEPBSAG, HCVAB, HEPAIGM, HEPBIGM in the last 72 hours. C-Diff No results for input(s): CDIFFTOX in the last 72 hours. Fecal Lactopherrin No results for input(s): FECLLACTOFRN in the last 72 hours.  Studies/Results: CT Angio Chest/Abd/Pel for Dissection W and/or Wo Contrast Result Date: 12/13/2023 EXAM: CTA CHEST, ABDOMEN AND PELVIS WITH AND WITHOUT CONTRAST 12/13/2023 02:14:14 PM TECHNIQUE: CTA of the chest was performed with and without the administration of intravenous contrast. CTA of the abdomen and  pelvis was performed with the administration of intravenous contrast. 100 mL of iohexol  (OMNIPAQUE ) 350 MG/ML injection was administered. Multiplanar reformatted images are provided for review. MIP images are provided for review. Automated exposure control, iterative reconstruction, and/or weight based adjustment of the mA/kV was utilized to reduce the radiation dose to as low as reasonably achievable. COMPARISON: 04/11/2023 CLINICAL HISTORY: Acute aortic syndrome (AAS) suspected; concern for food bolus. FINDINGS: VASCULATURE: AORTA: Scattered descending thoracic aortic calcifications. No acute finding. No abdominal aortic aneurysm. No dissection. PULMONARY ARTERIES: No pulmonary embolism with the limits of this exam. GREAT VESSELS OF AORTIC ARCH: No acute finding. No dissection. No arterial occlusion or significant stenosis. CELIAC TRUNK: No acute finding. No occlusion or significant stenosis. SUPERIOR MESENTERIC ARTERY: No acute finding. No occlusion or significant stenosis. INFERIOR MESENTERIC ARTERY: No acute finding. No occlusion or significant stenosis. RENAL ARTERIES: Duplicated left renal arteries, superior dominant, both patent. No occlusion or significant stenosis. ILIAC ARTERIES: Bilateral pelvic vascular calcifications. moderate aortoiliac atheromatous plaque without aneurysm or high grade stenosis. No occlusion or significant stenosis. CHEST: MEDIASTINUM: Common ostium for left pulmonary veins, an anatomic variant. Scattered right coronary calcifications. No mediastinal lymphadenopathy. The heart and pericardium demonstrate no acute abnormality. LUNGS AND PLEURA: Breathing motion somewhat degrades pulmonary parenchymal evaluation. The lungs are without acute process. No focal consolidation or pulmonary edema. No evidence of pleural effusion or pneumothorax. THORACIC BONES AND SOFT TISSUES: vertebral endplate spurring at  multiple levels in the lower thoracic spine. No acute soft tissue abnormality.  ABDOMEN AND PELVIS: LIVER: The liver is unremarkable. GALLBLADDER AND BILE DUCTS: Gallbladder is unremarkable. No biliary ductal dilatation. SPLEEN: The spleen is unremarkable. PANCREAS: The pancreas is unremarkable. ADRENAL GLANDS: Bilateral adrenal glands demonstrate no acute abnormality. KIDNEYS, URETERS AND BLADDER: No stones in the kidneys or ureters. No hydronephrosis. No perinephric or periureteral stranding. Urinary bladder is unremarkable. GI AND BOWEL: The mid and distal esophagus is mildly distended. Stomach and duodenal sweep demonstrate no acute abnormality. There is no bowel obstruction. No abnormal bowel wall thickening or distension. normal appendix. REPRODUCTIVE: Moderate prostate enlargement. PERITONEUM AND RETROPERITONEUM: No ascites or free air. LYMPH NODES: No lymphadenopathy. ABDOMINAL BONES AND SOFT TISSUES: lumbar spondylotic change most severe L5-S1. Mild bilateral hip DJD. No acute soft tissue abnormality. IMPRESSION: 1. No acute aortic syndrome. 2. Mildly distended mid and distal esophagus, which may reflect an esophageal food bolus impaction. Electronically signed by: Dayne Hassell MD 12/13/2023 02:37 PM EST RP Workstation: [id number]    Medications: Scheduled:  heparin  5,000 Units Subcutaneous Q8H   insulin  aspart  0-9 Units Subcutaneous Q4H   pantoprazole (PROTONIX) IV  40 mg Intravenous Q12H   sodium chloride  flush  3 mL Intravenous Q12H   Continuous:  sodium chloride  125 mL/hr at 12/14/23 0408   sodium chloride       Assessment/Plan: 1) Severe esophagitis, presumed to be reflux. 2) Dysphagia - resolved.   The patient is well clinically.  He will be advanced to a regular diet.  If he can tolerate the regular diet, then he can be discharged home with outpatient follow up.  Plan: 1) Regular diet. 2) D/C home if tolerating. 3) Follow up with [facility] in 2-4 weeks.  LOS: 1 day   Tammala Weider D 12/14/2023, 8:39 AM

## 2023-12-15 DIAGNOSIS — W44F3XA Food entering into or through a natural orifice, initial encounter: Secondary | ICD-10-CM | POA: Diagnosis not present

## 2023-12-15 DIAGNOSIS — K209 Esophagitis, unspecified without bleeding: Secondary | ICD-10-CM

## 2023-12-15 DIAGNOSIS — T18128A Food in esophagus causing other injury, initial encounter: Secondary | ICD-10-CM | POA: Diagnosis not present

## 2023-12-15 DIAGNOSIS — K2100 Gastro-esophageal reflux disease with esophagitis, without bleeding: Secondary | ICD-10-CM | POA: Diagnosis not present

## 2023-12-15 DIAGNOSIS — I10 Essential (primary) hypertension: Secondary | ICD-10-CM | POA: Diagnosis not present

## 2023-12-15 LAB — CBC WITH DIFFERENTIAL/PLATELET
Abs Immature Granulocytes: 0.04 K/uL (ref 0.00–0.07)
Basophils Absolute: 0 K/uL (ref 0.0–0.1)
Basophils Relative: 0 %
Eosinophils Absolute: 0 K/uL (ref 0.0–0.5)
Eosinophils Relative: 0 %
HCT: 44 % (ref 39.0–52.0)
Hemoglobin: 14.5 g/dL (ref 13.0–17.0)
Immature Granulocytes: 0 %
Lymphocytes Relative: 15 %
Lymphs Abs: 2 K/uL (ref 0.7–4.0)
MCH: 30.5 pg (ref 26.0–34.0)
MCHC: 33 g/dL (ref 30.0–36.0)
MCV: 92.4 fL (ref 80.0–100.0)
Monocytes Absolute: 1.6 K/uL — ABNORMAL HIGH (ref 0.1–1.0)
Monocytes Relative: 11 %
Neutro Abs: 10 K/uL — ABNORMAL HIGH (ref 1.7–7.7)
Neutrophils Relative %: 74 %
Platelets: 228 K/uL (ref 150–400)
RBC: 4.76 MIL/uL (ref 4.22–5.81)
RDW: 13.6 % (ref 11.5–15.5)
WBC: 13.7 K/uL — ABNORMAL HIGH (ref 4.0–10.5)
nRBC: 0 % (ref 0.0–0.2)

## 2023-12-15 LAB — BASIC METABOLIC PANEL WITH GFR
Anion gap: 14 (ref 5–15)
BUN: 21 mg/dL (ref 8–23)
CO2: 19 mmol/L — ABNORMAL LOW (ref 22–32)
Calcium: 8.9 mg/dL (ref 8.9–10.3)
Chloride: 108 mmol/L (ref 98–111)
Creatinine, Ser: 0.98 mg/dL (ref 0.61–1.24)
GFR, Estimated: >60 mL/min (ref >60)
Glucose, Bld: 129 mg/dL — ABNORMAL HIGH (ref 70–99)
Potassium: 3.6 mmol/L (ref 3.5–5.1)
Sodium: 141 mmol/L (ref 135–145)

## 2023-12-15 LAB — GLUCOSE, CAPILLARY
Glucose-Capillary: 125 mg/dL — ABNORMAL HIGH (ref 70–99)
Glucose-Capillary: 146 mg/dL — ABNORMAL HIGH (ref 70–99)
Glucose-Capillary: 155 mg/dL — ABNORMAL HIGH (ref 70–99)
Glucose-Capillary: 195 mg/dL — ABNORMAL HIGH (ref 70–99)

## 2023-12-15 MED ORDER — PANTOPRAZOLE SODIUM 40 MG PO TBEC: 40.0000 mg | DELAYED_RELEASE_TABLET | Freq: Two times a day (BID) | ORAL | 11 refills | Status: DC

## 2023-12-15 MED ORDER — PANTOPRAZOLE SODIUM 40 MG PO TBEC
DELAYED_RELEASE_TABLET | ORAL | 11 refills | Status: AC
Start: 2023-12-15 — End: ?

## 2023-12-15 NOTE — Anesthesia Postprocedure Evaluation (Signed)
 Anesthesia Post Note  Patient: Christopher Kirk  Procedure(s) Performed: EGD (ESOPHAGOGASTRODUODENOSCOPY)     Patient location during evaluation: PACU Anesthesia Type: General Level of consciousness: awake and alert Pain management: pain level controlled Vital Signs Assessment: post-procedure vital signs reviewed and stable Respiratory status: spontaneous breathing, nonlabored ventilation, respiratory function stable and patient connected to nasal cannula oxygen Cardiovascular status: blood pressure returned to baseline and stable Postop Assessment: no apparent nausea or vomiting Anesthetic complications: no   No notable events documented.  Last Vitals:  Vitals:   12/14/23 1955 12/15/23 0415  BP: (!) 145/73 137/69  Pulse: 88 83  Resp: 18 18  Temp: 37.2 C 36.9 C  SpO2: 99% 99%    Last Pain:  Vitals:   12/15/23 0415  TempSrc: Oral  PainSc: 0-No pain                 Ettamae Barkett S

## 2023-12-15 NOTE — Evaluation (Signed)
 Physical Therapy Evaluation and Discharge Patient Details Name: Christopher Kirk MRN: [id number] DOB: [birth year] Today's Date: 12/15/2023  History of Present Illness  Christopher Kirk is a 73 y.o. male now reporting weight loss and has had symptoms of upper epigastric to upper abdominal pain after eating chicken and states that usually when his food gets stuck he is able to vomit it out or eventually it goes down but this time it is worse and is not able to keep any clear liquids or anything down and the pain is severe; with past medical history of tobacco abuse,History of CVA, diabetes mellitus type 2, hyperlipidemia, GERD comes today with nausea vomiting abdominal pain. Patient reports history of dysphagia and food impaction symptoms since the past 50 years  Clinical Impression   Patient evaluated by Physical Therapy with no further acute PT needs identified. All education has been completed and the patient has no further questions.  See below for any follow-up Physical Therapy or equipment needs. PT is signing off. Thank you for this referral.         If plan is discharge home, recommend the following:     Can travel by private vehicle        Equipment Recommendations None recommended by PT  Recommendations for Other Services       Functional Status Assessment Patient has not had a recent decline in their functional status     Precautions / Restrictions Precautions Precautions: None Restrictions Weight Bearing Restrictions Per Provider Order: No      Mobility  Bed Mobility Overal bed mobility: Modified Independent                  Transfers Overall transfer level: Modified independent Equipment used: None                    Ambulation/Gait Ambulation/Gait assistance: Modified independent (Device/Increase time) Gait Distance (Feet): 250 Feet Assistive device: None         General Gait Details: No difficulties  Stairs Stairs: Yes Stairs assistance:  Supervision Stair Management: Two rails, Alternating pattern, Forwards Number of Stairs: 12 General stair comments: no difficulties  Wheelchair Mobility     Tilt Bed    Modified Rankin (Stroke Patients Only)       Balance Overall balance assessment: No apparent balance deficits (not formally assessed)                                           Pertinent Vitals/Pain Pain Assessment Pain Assessment: Faces Faces Pain Scale: Hurts even more Pain Location: throat with swallowing Pain Descriptors / Indicators: Sharp Pain Intervention(s): Monitored during session    Home Living Family/patient expects to be discharged to:: Private residence Living Arrangements: Spouse/significant other Available Help at Discharge: Family Type of Home: House Home Access: Stairs to enter Entrance Stairs-Rails: Right;Left;Can reach both Secretary/administrator of Steps: 5   Home Layout: One level Home Equipment: Agricultural Consultant (2 wheels);Cane - single point      Prior Function Prior Level of Function : Independent/Modified Independent                     Extremity/Trunk Assessment   Upper Extremity Assessment Upper Extremity Assessment: Overall WFL for tasks assessed    Lower Extremity Assessment Lower Extremity Assessment: Overall WFL for tasks assessed (fro simple tasks assessed)  Communication   Communication Communication: No apparent difficulties    Cognition Arousal: Alert Behavior During Therapy: WFL for tasks assessed/performed   PT - Cognitive impairments: No apparent impairments                         Following commands: Intact       Cueing Cueing Techniques: Verbal cues     General Comments General comments (skin integrity, edema, etc.): Wife present and helpful    Exercises     Assessment/Plan    PT Assessment Patient does not need any further PT services  PT Problem List         PT Treatment Interventions       PT Goals (Current goals can be found in the Care Plan section)  Acute Rehab PT Goals Patient Stated Goal: Home today PT Goal Formulation: All assessment and education complete, DC therapy    Frequency       Co-evaluation               AM-PAC PT 6 Clicks Mobility  Outcome Measure Help needed turning from your back to your side while in a flat bed without using bedrails?: None Help needed moving from lying on your back to sitting on the side of a flat bed without using bedrails?: None Help needed moving to and from a bed to a chair (including a wheelchair)?: None Help needed standing up from a chair using your arms (e.g., wheelchair or bedside chair)?: None Help needed to walk in hospital room?: None Help needed climbing 3-5 steps with a railing? : None 6 Click Score: 24    End of Session   Activity Tolerance: Patient tolerated treatment well Patient left: in chair;with call bell/phone within reach;with family/visitor present Nurse Communication: Mobility status PT Visit Diagnosis: Muscle weakness (generalized) (M62.81)    Time: 9150-9090 PT Time Calculation (min) (ACUTE ONLY): 20 min   Charges:   PT Evaluation $PT Eval Low Complexity: 1 Low   PT General Charges $$ ACUTE PT VISIT: 1 Visit         Silvano Currier, PT  Acute Rehabilitation Services Office [phone] Secure Chat welcomed   Silvano VEAR Currier 12/15/2023, 12:13 PM

## 2023-12-15 NOTE — Plan of Care (Signed)
   Problem: Education: Goal: Knowledge of General Education information will improve Description: Including pain rating scale, medication(s)/side effects and non-pharmacologic comfort measures Outcome: Completed/Met

## 2023-12-15 NOTE — Progress Notes (Addendum)
 Patient ID: Christopher Kirk, male   DOB: Mar 27, 1950, 73 y.o.   MRN: [id number]    Progress Note   Subjective   Day # 2 CC;Dysphagia  EGD yesterday-grade D esophagitis, no food impaction, no definite stricture, biopsies were done and are pending.  Patient says he feels better today he has been able to take very soft foods, applesauce etc. without any difficulty and thinks that he can manage at home.   Objective   Vital signs in last 24 hours: Temp:  [97.5 F (36.4 C)-99 F (37.2 C)] 97.5 F (36.4 C) (11/17 0945) Pulse Rate:  [73-98] 73 (11/17 0945) Resp:  [18-19] 18 (11/17 0945) BP: (133-145)/(62-73) 136/62 (11/17 0945) SpO2:  [97 %-100 %] 100 % (11/17 0945) Last BM Date : 12/11/23 General:   Older white male in NAD Heart:  Regular rate and rhythm; no murmurs Lungs: Respirations even and unlabored, lungs CTA bilaterally Abdomen:  Soft, nontender and nondistended. Normal bowel sounds. Extremities:  Without edema. Neurologic:  Alert and oriented,  grossly normal neurologically. Psych:  Cooperative. Normal mood and affect.  Intake/Output from previous day: 11/16 0701 - 11/17 0700 In: 240 [P.O.:240] Out: 2455 [Urine:2455] Intake/Output this shift: Total I/O In: -  Out: 600 [Urine:600]  Lab Results: Recent Labs    12/13/23 1223 12/13/23 1241 12/14/23 0109 12/15/23 0410  WBC 23.1*  --  20.4* 13.7*  HGB 17.6* 18.7* 15.9 14.5  HCT 54.8* 55.0* 48.9 44.0  PLT 368  --  289 228   BMET Recent Labs    12/13/23 1223 12/13/23 1241 12/14/23 0109 12/15/23 0410  NA 140 139 142 141  K 4.5 4.6 4.2 3.6  CL 100 108 111 108  CO2 12*  --  13* 19*  GLUCOSE 192* 190* 152* 129*  BUN 39* 50* 32* 21  CREATININE 1.62* 1.00 1.30*  1.37* 0.98  CALCIUM  10.1  --  9.2 8.9   LFT Recent Labs    12/14/23 0109  PROT 7.0  ALBUMIN 4.0  AST 12*  ALT 19  ALKPHOS 74  BILITOT 1.7*   PT/INR No results for input(s): LABPROT, INR in the last 72 hours.  Studies/Results: CT Angio  Chest/Abd/Pel for Dissection W and/or Wo Contrast Result Date: 12/13/2023 EXAM: CTA CHEST, ABDOMEN AND PELVIS WITH AND WITHOUT CONTRAST 12/13/2023 02:14:14 PM TECHNIQUE: CTA of the chest was performed with and without the administration of intravenous contrast. CTA of the abdomen and pelvis was performed with the administration of intravenous contrast. 100 mL of iohexol  (OMNIPAQUE ) 350 MG/ML injection was administered. Multiplanar reformatted images are provided for review. MIP images are provided for review. Automated exposure control, iterative reconstruction, and/or weight based adjustment of the mA/kV was utilized to reduce the radiation dose to as low as reasonably achievable. COMPARISON: 04/11/2023 CLINICAL HISTORY: Acute aortic syndrome (AAS) suspected; concern for food bolus. FINDINGS: VASCULATURE: AORTA: Scattered descending thoracic aortic calcifications. No acute finding. No abdominal aortic aneurysm. No dissection. PULMONARY ARTERIES: No pulmonary embolism with the limits of this exam. GREAT VESSELS OF AORTIC ARCH: No acute finding. No dissection. No arterial occlusion or significant stenosis. CELIAC TRUNK: No acute finding. No occlusion or significant stenosis. SUPERIOR MESENTERIC ARTERY: No acute finding. No occlusion or significant stenosis. INFERIOR MESENTERIC ARTERY: No acute finding. No occlusion or significant stenosis. RENAL ARTERIES: Duplicated left renal arteries, superior dominant, both patent. No occlusion or significant stenosis. ILIAC ARTERIES: Bilateral pelvic vascular calcifications. moderate aortoiliac atheromatous plaque without aneurysm or high grade stenosis. No occlusion or significant stenosis.  CHEST: MEDIASTINUM: Common ostium for left pulmonary veins, an anatomic variant. Scattered right coronary calcifications. No mediastinal lymphadenopathy. The heart and pericardium demonstrate no acute abnormality. LUNGS AND PLEURA: Breathing motion somewhat degrades pulmonary parenchymal  evaluation. The lungs are without acute process. No focal consolidation or pulmonary edema. No evidence of pleural effusion or pneumothorax. THORACIC BONES AND SOFT TISSUES: vertebral endplate spurring at multiple levels in the lower thoracic spine. No acute soft tissue abnormality. ABDOMEN AND PELVIS: LIVER: The liver is unremarkable. GALLBLADDER AND BILE DUCTS: Gallbladder is unremarkable. No biliary ductal dilatation. SPLEEN: The spleen is unremarkable. PANCREAS: The pancreas is unremarkable. ADRENAL GLANDS: Bilateral adrenal glands demonstrate no acute abnormality. KIDNEYS, URETERS AND BLADDER: No stones in the kidneys or ureters. No hydronephrosis. No perinephric or periureteral stranding. Urinary bladder is unremarkable. GI AND BOWEL: The mid and distal esophagus is mildly distended. Stomach and duodenal sweep demonstrate no acute abnormality. There is no bowel obstruction. No abnormal bowel wall thickening or distension. normal appendix. REPRODUCTIVE: Moderate prostate enlargement. PERITONEUM AND RETROPERITONEUM: No ascites or free air. LYMPH NODES: No lymphadenopathy. ABDOMINAL BONES AND SOFT TISSUES: lumbar spondylotic change most severe L5-S1. Mild bilateral hip DJD. No acute soft tissue abnormality. IMPRESSION: 1. No acute aortic syndrome. 2. Mildly distended mid and distal esophagus, which may reflect an esophageal food bolus impaction. Electronically signed by: Dayne Hassell MD 12/13/2023 02:37 PM EST RP Workstation: [id number]       Assessment / Plan:    #56 73 year old white male who presented with acute dysphagia and concern for food impaction.  Symptoms onset Thursday, 12/11/2023  EGD 12/13/2023 with finding of grade D esophagitis, no food impaction present and no definite stricture  Suspect he may have had a self resolved food impaction by history.  Able to tolerate soft foods today no significant odynophagia, no vomiting or regurgitation  #2 prior history of CVA #3 diabetes  mellitus 4 hypertension  Plan; okay for discharge home from GI perspective advised soft diet Elevate head of bed 45 degrees for sleep Please discharge on twice daily Protonix x 1 month then 40 mg once daily thereafter indefinitely  Patient has actually seen [facility] in the past/Dr. Elicia and advised patient to have a follow-up appointment with Dr. Elicia in his office.  If patient's symptoms persist then he may require repeat EGD or other esophageal imaging in several weeks.     Principal Problem:   Food impaction of esophagus, initial encounter     LOS: 2 days   Amy Esterwood PA-C 12/15/2023, 1:11 PM   Attending physician's note   I have reviewed the chart discussed his care extensively on rounds today.  I performed a substantive portion of this encounter, including complete performance of at least one of the key components, in conjunction with the APP. I agree with the APP's note, impression, and recommendations with my edits.  Doing better since EGD and starting on high-dose PPI therapy and tolerating slowly advancing diet.  Sent a message to Dr. Elicia to follow-up in his office as he has been seen by him in the past and he can resume ongoing outpatient care there.  Otherwise okay for d/c home from a GI standpoint.  A total of 35 minutes of time was spent on this encounter, including in depth chart review, independent review of results as outlined above, communicating results with the patient directly, face-to-face time with the patient, coordinating care, and ordering studies and medications as appropriate, and documentation.   Novalie Leamy, DO, FACG (  [phone] office

## 2023-12-15 NOTE — Discharge Summary (Incomplete)
 Physician Discharge Summary   Patient: Christopher Kirk MRN: [id number] DOB: 10-29-1950  Admit date:     12/13/2023  Discharge date: 12/15/23  Discharge Physician: Elgie Butter   PCP: Avva, Ravisankar, MD   Recommendations at discharge:  Please follow-up with gastroenterology in 1 to 2 weeks h, for the biopsy results Please follow-up with PCP and repeat lab work including CBC and BMP We have held your Norvasc  as your blood pressures have been good  Discharge Diagnoses: Principal Problem:   Food impaction of esophagus, initial encounter  Resolved Problems:   * No resolved hospital problems. *  Hospital Course: Christopher Kirk is a 73 y.o. male with past medical history  of tobacco abuse,History of CVA, diabetes mellitus type 2, hyperlipidemia, GERD comes today with nausea vomiting abdominal pain.  Patient reports history of dysphagia and food impaction symptoms since the past 50 years now reporting weight loss and since Thursday has had symptoms of upper epigastric to upper abdominal pain after eating chicken and states that usually when his food gets stuck he is able to vomit it out or eventually it goes down but this time it is worse and is not able to keep any clear liquids or anything down and the pain is severe.  GI consulted, and he underwent EGD showing   LA Grade D (one or more mucosal breaks involving at least 75% of       esophageal circumference) esophagitis with no bleeding was found in the       entire esophagus. Biopsies were taken with a cold forceps for histology.      One cratered esophageal ulcer was found at the gastroesophageal       junction. Biopsies were taken with a cold forceps for histology.    Assessment and Plan:    Dysphagia  S/p EGD showing esophagitis, and esophageal ulcer at GE junction.  Continue with IV ppi.  Pt not able to tolerate solids yet. Continue with clears for now.  Advance slowly as tolerated.        Type 2 DM with  hyperglycemia Resume home medications     Hyperlipidemia On stain, on hold for now.      H/o CVA On stain and plavix   to resume on discharge.       Hypertension:  Well controlled. Holding for now.    AKI Creatinine at baseline around 1.  Creatinine around 1.3.  Continue with IV fluids.  Avoid hypotension, and nephrotoxic agents.      Leukocytosis Suspect reactive.  Repeat labs in am.    Estimated body mass index is 23.31 kg/m as calculated from the following:   Height as of this encounter: 5' 11 (1.803 m).   Weight as of this encounter: 75.8 kg.   {(NOTE) Pain control PDMP Statment (Optional):26782} Consultants: *** Procedures performed: ***  Disposition: {Plan; Disposition:26390} Diet recommendation:  Discharge Diet Orders (From admission, onward)     Start     Ordered   12/15/23 0000  Diet - low sodium heart healthy        12/15/23 1229           {Diet_Plan:26776} DISCHARGE MEDICATION: Allergies as of 12/15/2023       Reactions   Celery Oil Rash        Medication List     STOP taking these medications    amLODipine  10 MG tablet Commonly known as: NORVASC    clopidogrel  75 MG tablet Commonly known as: PLAVIX   TAKE these medications    atorvastatin  80 MG tablet Commonly known as: LIPITOR  TAKE 1 TABLET BY MOUTH EVERY DAY   carvedilol  6.25 MG tablet Commonly known as: COREG  Take 1 tablet (6.25 mg total) by mouth 2 (two) times daily.   irbesartan  300 MG tablet Commonly known as: AVAPRO  Take 1 tablet (300 mg total) by mouth daily.   Jardiance 10 MG Tabs tablet Generic drug: empagliflozin Take 10 mg by mouth daily.   metFORMIN 500 MG 24 hr tablet Commonly known as: GLUCOPHAGE-XR Take 500 mg by mouth every evening.   pantoprazole 40 MG tablet Commonly known as: Protonix Take 1 tablet (40 mg total) by mouth 2 (two) times daily.        Discharge Exam: Filed Weights   12/13/23 1222 12/14/23 0453  Weight: 80.3 kg 75.8  kg   ***  Condition at discharge: {DC Condition:26389}  The results of significant diagnostics from this hospitalization (including imaging, microbiology, ancillary and laboratory) are listed below for reference.   Imaging Studies: CT Angio Chest/Abd/Pel for Dissection W and/or Wo Contrast Result Date: 12/13/2023 EXAM: CTA CHEST, ABDOMEN AND PELVIS WITH AND WITHOUT CONTRAST 12/13/2023 02:14:14 PM TECHNIQUE: CTA of the chest was performed with and without the administration of intravenous contrast. CTA of the abdomen and pelvis was performed with the administration of intravenous contrast. 100 mL of iohexol  (OMNIPAQUE ) 350 MG/ML injection was administered. Multiplanar reformatted images are provided for review. MIP images are provided for review. Automated exposure control, iterative reconstruction, and/or weight based adjustment of the mA/kV was utilized to reduce the radiation dose to as low as reasonably achievable. COMPARISON: 04/11/2023 CLINICAL HISTORY: Acute aortic syndrome (AAS) suspected; concern for food bolus. FINDINGS: VASCULATURE: AORTA: Scattered descending thoracic aortic calcifications. No acute finding. No abdominal aortic aneurysm. No dissection. PULMONARY ARTERIES: No pulmonary embolism with the limits of this exam. GREAT VESSELS OF AORTIC ARCH: No acute finding. No dissection. No arterial occlusion or significant stenosis. CELIAC TRUNK: No acute finding. No occlusion or significant stenosis. SUPERIOR MESENTERIC ARTERY: No acute finding. No occlusion or significant stenosis. INFERIOR MESENTERIC ARTERY: No acute finding. No occlusion or significant stenosis. RENAL ARTERIES: Duplicated left renal arteries, superior dominant, both patent. No occlusion or significant stenosis. ILIAC ARTERIES: Bilateral pelvic vascular calcifications. moderate aortoiliac atheromatous plaque without aneurysm or high grade stenosis. No occlusion or significant stenosis. CHEST: MEDIASTINUM: Common ostium for  left pulmonary veins, an anatomic variant. Scattered right coronary calcifications. No mediastinal lymphadenopathy. The heart and pericardium demonstrate no acute abnormality. LUNGS AND PLEURA: Breathing motion somewhat degrades pulmonary parenchymal evaluation. The lungs are without acute process. No focal consolidation or pulmonary edema. No evidence of pleural effusion or pneumothorax. THORACIC BONES AND SOFT TISSUES: vertebral endplate spurring at multiple levels in the lower thoracic spine. No acute soft tissue abnormality. ABDOMEN AND PELVIS: LIVER: The liver is unremarkable. GALLBLADDER AND BILE DUCTS: Gallbladder is unremarkable. No biliary ductal dilatation. SPLEEN: The spleen is unremarkable. PANCREAS: The pancreas is unremarkable. ADRENAL GLANDS: Bilateral adrenal glands demonstrate no acute abnormality. KIDNEYS, URETERS AND BLADDER: No stones in the kidneys or ureters. No hydronephrosis. No perinephric or periureteral stranding. Urinary bladder is unremarkable. GI AND BOWEL: The mid and distal esophagus is mildly distended. Stomach and duodenal sweep demonstrate no acute abnormality. There is no bowel obstruction. No abnormal bowel wall thickening or distension. normal appendix. REPRODUCTIVE: Moderate prostate enlargement. PERITONEUM AND RETROPERITONEUM: No ascites or free air. LYMPH NODES: No lymphadenopathy. ABDOMINAL BONES AND SOFT TISSUES: lumbar spondylotic change most severe L5-S1.  Mild bilateral hip DJD. No acute soft tissue abnormality. IMPRESSION: 1. No acute aortic syndrome. 2. Mildly distended mid and distal esophagus, which may reflect an esophageal food bolus impaction. Electronically signed by: Dayne Hassell MD 12/13/2023 02:37 PM EST RP Workstation: [id number]    Microbiology: Results for orders placed or performed during the hospital encounter of 12/13/23  Blood culture (routine x 2)     Status: None (Preliminary result)   Collection Time: 12/13/23  8:27 PM   Specimen: BLOOD LEFT  ARM  Result Value Ref Range Status   Specimen Description BLOOD LEFT ARM  Final   Special Requests   Final    BOTTLES DRAWN AEROBIC AND ANAEROBIC Blood Culture adequate volume   Culture   Final    NO GROWTH 2 DAYS Performed at [facility], [address] [place], [place] [address]    Report Status PENDING  Incomplete  Blood culture (routine x 2)     Status: None (Preliminary result)   Collection Time: 12/13/23  8:27 PM   Specimen: BLOOD LEFT HAND  Result Value Ref Range Status   Specimen Description BLOOD LEFT HAND  Final   Special Requests   Final    BOTTLES DRAWN AEROBIC AND ANAEROBIC Blood Culture results may not be optimal due to an inadequate volume of blood received in culture bottles   Culture   Final    NO GROWTH 2 DAYS Performed at [facility], [address] [place], [place] [address]    Report Status PENDING  Incomplete    Labs: CBC: Recent Labs  Lab 12/13/23 1223 12/13/23 1241 12/14/23 0109 12/15/23 0410  WBC 23.1*  --  20.4* 13.7*  NEUTROABS 19.2*  --   --  10.0*  HGB 17.6* 18.7* 15.9 14.5  HCT 54.8* 55.0* 48.9 44.0  MCV 95.5  --  94.2 92.4  PLT 368  --  289 228   Basic Metabolic Panel: Recent Labs  Lab 12/13/23 1223 12/13/23 1241 12/14/23 0109 12/15/23 0410  NA 140 139 142 141  K 4.5 4.6 4.2 3.6  CL 100 108 111 108  CO2 12*  --  13* 19*  GLUCOSE 192* 190* 152* 129*  BUN 39* 50* 32* 21  CREATININE 1.62* 1.00 1.30*  1.37* 0.98  CALCIUM  10.1  --  9.2 8.9  MG  --   --  2.3  --    Liver Function Tests: Recent Labs  Lab 12/13/23 1223 12/14/23 0109  AST 19 12*  ALT 24 19  ALKPHOS 93 74  BILITOT 2.9* 1.7*  PROT 8.1 7.0  ALBUMIN 4.9 4.0   CBG: Recent Labs  Lab 12/14/23 1958 12/15/23 0006 12/15/23 0418 12/15/23 0800 12/15/23 1109  GLUCAP 146* 155* 125* 195* 146*    Discharge time spent: {LESS THAN/GREATER THAN:26388} 30 minutes.  Signed: Elgie Butter, MD [facility] 12/15/2023

## 2023-12-16 LAB — SURGICAL PATHOLOGY

## 2023-12-18 LAB — CULTURE, BLOOD (ROUTINE X 2)
Culture: NO GROWTH
Culture: NO GROWTH
Special Requests: ADEQUATE

## 2023-12-20 NOTE — Discharge Summary (Signed)
 Physician Discharge Summary   Patient: Christopher Kirk MRN: [id number] DOB: 1950-09-04  Admit date:     12/13/2023  Discharge date: 12/15/2023  Discharge Physician: Elgie Butter   PCP: Avva, Ravisankar, MD   Recommendations at discharge:  Please follow up with GI as scheduled.  Please follow up with cbc and bmp in one week.   Discharge Diagnoses: Principal Problem:   Food impaction of esophagus, initial encounter  Resolved Problems:   * No resolved hospital problems. *  Hospital Course: Christopher Kirk is a 73 y.o. male with past medical history  of tobacco abuse,History of CVA, diabetes mellitus type 2, hyperlipidemia, GERD comes today with nausea vomiting abdominal pain.  Patient reports history of dysphagia and food impaction symptoms since the past 50 years now reporting weight loss and since Thursday has had symptoms of upper epigastric to upper abdominal pain after eating chicken and states that usually when his food gets stuck he is able to vomit it out or eventually it goes down but this time it is worse and is not able to keep any clear liquids or anything down and the pain is severe.  GI consulted, and he underwent EGD showing   LA Grade D (one or more mucosal breaks involving at least 75% of       esophageal circumference) esophagitis with no bleeding was found in the       entire esophagus. Biopsies were taken with a cold forceps for histology.      One cratered esophageal ulcer was found at the gastroesophageal       junction. Biopsies were taken with a cold forceps for histology.  Assessment and Plan:    Dysphagia  S/p EGD showing esophagitis, and esophageal ulcer at GE junction.  Continue with IV ppi. Transition to oral on discharge.  He was started on clears ad slowly advance as tolerated  Advance slowly as tolerated.        Type 2 DM with hyperglycemia CBG (last 3)  Resume home meds on discharge.      Hyperlipidemia On statin.      H/o CVA On stain and  plavix   to resume on discharge.       Hypertension:  Well controlled.    AKI Creatinine at baseline around 1.  Creatinine around 1.3.  resolved with IV fluids.  Avoid hypotension, and nephrotoxic agents.      Leukocytosis Suspect reactive. Recommend checking the cbc in 1 to 2 weeks.   Estimated body mass index is 23.31 kg/m as calculated from the following:   Height as of this encounter: 5' 11 (1.803 m).   Consultants: GI Procedures performed: egd  Disposition: Home Diet recommendation:  Discharge Diet Orders (From admission, onward)     Start     Ordered   12/15/23 0000  Diet - low sodium heart healthy        12/15/23 1229           Soft diet  DISCHARGE MEDICATION: Allergies as of 12/15/2023       Reactions   Celery Oil Rash        Medication List     STOP taking these medications    amLODipine  10 MG tablet Commonly known as: NORVASC        TAKE these medications    atorvastatin  80 MG tablet Commonly known as: LIPITOR  TAKE 1 TABLET BY MOUTH EVERY DAY   carvedilol  6.25 MG tablet Commonly known as: COREG  Take 1 tablet (6.25  mg total) by mouth 2 (two) times daily.   clopidogrel  75 MG tablet Commonly known as: PLAVIX  Take 1 tablet (75 mg total) by mouth daily.   irbesartan  300 MG tablet Commonly known as: AVAPRO  Take 1 tablet (300 mg total) by mouth daily.   Jardiance 10 MG Tabs tablet Generic drug: empagliflozin Take 10 mg by mouth daily.   metFORMIN 500 MG 24 hr tablet Commonly known as: GLUCOPHAGE-XR Take 500 mg by mouth every evening.   pantoprazole  40 MG tablet Commonly known as: Protonix  Pantoprazole  40 mg BID for 4 weeks followed by  Pantoprazole  40 mg daily        Discharge Exam: Filed Weights   12/13/23 1222 12/14/23 0453  Weight: 80.3 kg 75.8 kg   General exam: Appears calm and comfortable  Respiratory system: Clear to auscultation. Respiratory effort normal. Cardiovascular system: S1 & S2 heard, RRR.   Gastrointestinal system: Abdomen is nondistended, soft and nontender. Central nervous system: Alert and oriented. No focal neurological deficits. Extremities: Symmetric 5 x 5 power. Skin: No rashes, lesions or ulcers Psychiatry: Judgement and insight appear normal. Mood & affect appropriate.    Condition at discharge: fair  The results of significant diagnostics from this hospitalization (including imaging, microbiology, ancillary and laboratory) are listed below for reference.   Imaging Studies: CT Angio Chest/Abd/Pel for Dissection W and/or Wo Contrast Result Date: 12/13/2023 EXAM: CTA CHEST, ABDOMEN AND PELVIS WITH AND WITHOUT CONTRAST 12/13/2023 02:14:14 PM TECHNIQUE: CTA of the chest was performed with and without the administration of intravenous contrast. CTA of the abdomen and pelvis was performed with the administration of intravenous contrast. 100 mL of iohexol  (OMNIPAQUE ) 350 MG/ML injection was administered. Multiplanar reformatted images are provided for review. MIP images are provided for review. Automated exposure control, iterative reconstruction, and/or weight based adjustment of the mA/kV was utilized to reduce the radiation dose to as low as reasonably achievable. COMPARISON: 04/11/2023 CLINICAL HISTORY: Acute aortic syndrome (AAS) suspected; concern for food bolus. FINDINGS: VASCULATURE: AORTA: Scattered descending thoracic aortic calcifications. No acute finding. No abdominal aortic aneurysm. No dissection. PULMONARY ARTERIES: No pulmonary embolism with the limits of this exam. GREAT VESSELS OF AORTIC ARCH: No acute finding. No dissection. No arterial occlusion or significant stenosis. CELIAC TRUNK: No acute finding. No occlusion or significant stenosis. SUPERIOR MESENTERIC ARTERY: No acute finding. No occlusion or significant stenosis. INFERIOR MESENTERIC ARTERY: No acute finding. No occlusion or significant stenosis. RENAL ARTERIES: Duplicated left renal arteries, superior  dominant, both patent. No occlusion or significant stenosis. ILIAC ARTERIES: Bilateral pelvic vascular calcifications. moderate aortoiliac atheromatous plaque without aneurysm or high grade stenosis. No occlusion or significant stenosis. CHEST: MEDIASTINUM: Common ostium for left pulmonary veins, an anatomic variant. Scattered right coronary calcifications. No mediastinal lymphadenopathy. The heart and pericardium demonstrate no acute abnormality. LUNGS AND PLEURA: Breathing motion somewhat degrades pulmonary parenchymal evaluation. The lungs are without acute process. No focal consolidation or pulmonary edema. No evidence of pleural effusion or pneumothorax. THORACIC BONES AND SOFT TISSUES: vertebral endplate spurring at multiple levels in the lower thoracic spine. No acute soft tissue abnormality. ABDOMEN AND PELVIS: LIVER: The liver is unremarkable. GALLBLADDER AND BILE DUCTS: Gallbladder is unremarkable. No biliary ductal dilatation. SPLEEN: The spleen is unremarkable. PANCREAS: The pancreas is unremarkable. ADRENAL GLANDS: Bilateral adrenal glands demonstrate no acute abnormality. KIDNEYS, URETERS AND BLADDER: No stones in the kidneys or ureters. No hydronephrosis. No perinephric or periureteral stranding. Urinary bladder is unremarkable. GI AND BOWEL: The mid and distal esophagus is mildly distended.  Stomach and duodenal sweep demonstrate no acute abnormality. There is no bowel obstruction. No abnormal bowel wall thickening or distension. normal appendix. REPRODUCTIVE: Moderate prostate enlargement. PERITONEUM AND RETROPERITONEUM: No ascites or free air. LYMPH NODES: No lymphadenopathy. ABDOMINAL BONES AND SOFT TISSUES: lumbar spondylotic change most severe L5-S1. Mild bilateral hip DJD. No acute soft tissue abnormality. IMPRESSION: 1. No acute aortic syndrome. 2. Mildly distended mid and distal esophagus, which may reflect an esophageal food bolus impaction. Electronically signed by: Dayne Hassell MD  12/13/2023 02:37 PM EST RP Workstation: [id number]    Microbiology: Results for orders placed or performed during the hospital encounter of 12/13/23  Blood culture (routine x 2)     Status: None   Collection Time: 12/13/23  8:27 PM   Specimen: BLOOD LEFT ARM  Result Value Ref Range Status   Specimen Description BLOOD LEFT ARM  Final   Special Requests   Final    BOTTLES DRAWN AEROBIC AND ANAEROBIC Blood Culture adequate volume   Culture   Final    NO GROWTH 5 DAYS Performed at [facility], [address] [place], [place] [address]    Report Status 12/18/2023 FINAL  Final  Blood culture (routine x 2)     Status: None   Collection Time: 12/13/23  8:27 PM   Specimen: BLOOD LEFT HAND  Result Value Ref Range Status   Specimen Description BLOOD LEFT HAND  Final   Special Requests   Final    BOTTLES DRAWN AEROBIC AND ANAEROBIC Blood Culture results may not be optimal due to an inadequate volume of blood received in culture bottles   Culture   Final    NO GROWTH 5 DAYS Performed at [facility], [address] [place], [place] [address]    Report Status 12/18/2023 FINAL  Final    Labs: CBC: Recent Labs  Lab 12/14/23 0109 12/15/23 0410  WBC 20.4* 13.7*  NEUTROABS  --  10.0*  HGB 15.9 14.5  HCT 48.9 44.0  MCV 94.2 92.4  PLT 289 228   Basic Metabolic Panel: Recent Labs  Lab 12/14/23 0109 12/15/23 0410  NA 142 141  K 4.2 3.6  CL 111 108  CO2 13* 19*  GLUCOSE 152* 129*  BUN 32* 21  CREATININE 1.30*  1.37* 0.98  CALCIUM  9.2 8.9  MG 2.3  --    Liver Function Tests: Recent Labs  Lab 12/14/23 0109  AST 12*  ALT 19  ALKPHOS 74  BILITOT 1.7*  PROT 7.0  ALBUMIN 4.0   CBG: Recent Labs  Lab 12/14/23 1958 12/15/23 0006 12/15/23 0418 12/15/23 0800 12/15/23 1109  GLUCAP 146* 155* 125* 195* 146*    Discharge time spent: 37 minutes.   Signed: Ahlana Slaydon, MD [facility]
